# Patient Record
Sex: Male | Born: 1970 | Race: White | Hispanic: No | Marital: Married | Smoking: Never smoker
Health system: Southern US, Community
[De-identification: ages and names within clinical notes are randomized; demographics above are authoritative.]

## PROBLEM LIST (undated history)

## (undated) DIAGNOSIS — M109 Gout, unspecified: Secondary | ICD-10-CM

## (undated) DIAGNOSIS — J45909 Unspecified asthma, uncomplicated: Secondary | ICD-10-CM

## (undated) DIAGNOSIS — Z8739 Personal history of other diseases of the musculoskeletal system and connective tissue: Secondary | ICD-10-CM

## (undated) DIAGNOSIS — R1310 Dysphagia, unspecified: Secondary | ICD-10-CM

## (undated) DIAGNOSIS — K219 Gastro-esophageal reflux disease without esophagitis: Secondary | ICD-10-CM

## (undated) DIAGNOSIS — M1990 Unspecified osteoarthritis, unspecified site: Secondary | ICD-10-CM

## (undated) DIAGNOSIS — J189 Pneumonia, unspecified organism: Secondary | ICD-10-CM

## (undated) HISTORY — DX: Gout, unspecified: M10.9

## (undated) HISTORY — DX: Gastro-esophageal reflux disease without esophagitis: K21.9

## (undated) HISTORY — PX: BACK SURGERY: SHX140

## (undated) HISTORY — DX: Dysphagia, unspecified: R13.10

---

## 1999-01-27 ENCOUNTER — Encounter: Payer: Self-pay | Admitting: Neurosurgery

## 1999-01-27 ENCOUNTER — Ambulatory Visit (HOSPITAL_COMMUNITY): Admission: RE | Admit: 1999-01-27 | Discharge: 1999-01-27 | Payer: Self-pay | Admitting: Neurosurgery

## 1999-02-10 ENCOUNTER — Ambulatory Visit (HOSPITAL_COMMUNITY): Admission: RE | Admit: 1999-02-10 | Discharge: 1999-02-10 | Payer: Self-pay | Admitting: Neurosurgery

## 1999-02-10 ENCOUNTER — Encounter: Payer: Self-pay | Admitting: Neurosurgery

## 1999-02-24 ENCOUNTER — Ambulatory Visit (HOSPITAL_COMMUNITY): Admission: RE | Admit: 1999-02-24 | Discharge: 1999-02-24 | Payer: Self-pay | Admitting: Neurosurgery

## 1999-02-24 ENCOUNTER — Encounter: Payer: Self-pay | Admitting: Neurosurgery

## 2000-02-26 ENCOUNTER — Ambulatory Visit (HOSPITAL_COMMUNITY): Admission: RE | Admit: 2000-02-26 | Discharge: 2000-02-26 | Payer: Self-pay | Admitting: Neurosurgery

## 2000-02-26 ENCOUNTER — Encounter: Payer: Self-pay | Admitting: Neurosurgery

## 2000-04-23 ENCOUNTER — Ambulatory Visit (HOSPITAL_COMMUNITY): Admission: RE | Admit: 2000-04-23 | Discharge: 2000-04-23 | Payer: Self-pay | Admitting: Neurosurgery

## 2000-04-23 ENCOUNTER — Encounter: Payer: Self-pay | Admitting: Neurosurgery

## 2000-08-25 ENCOUNTER — Emergency Department (HOSPITAL_COMMUNITY): Admission: EM | Admit: 2000-08-25 | Discharge: 2000-08-25 | Payer: Self-pay | Admitting: Emergency Medicine

## 2001-11-29 ENCOUNTER — Emergency Department (HOSPITAL_COMMUNITY): Admission: EM | Admit: 2001-11-29 | Discharge: 2001-11-29 | Payer: Self-pay | Admitting: Emergency Medicine

## 2001-12-30 ENCOUNTER — Encounter: Payer: Self-pay | Admitting: Neurosurgery

## 2002-01-03 ENCOUNTER — Encounter: Payer: Self-pay | Admitting: Neurosurgery

## 2002-01-03 ENCOUNTER — Inpatient Hospital Stay (HOSPITAL_COMMUNITY): Admission: RE | Admit: 2002-01-03 | Discharge: 2002-01-06 | Payer: Self-pay | Admitting: Neurosurgery

## 2002-06-06 ENCOUNTER — Encounter: Admission: RE | Admit: 2002-06-06 | Discharge: 2002-06-06 | Payer: Self-pay | Admitting: Neurosurgery

## 2002-06-06 ENCOUNTER — Encounter: Payer: Self-pay | Admitting: Neurosurgery

## 2004-04-02 ENCOUNTER — Ambulatory Visit (HOSPITAL_COMMUNITY): Admission: RE | Admit: 2004-04-02 | Discharge: 2004-04-02 | Payer: Self-pay | Admitting: Neurosurgery

## 2007-01-22 ENCOUNTER — Emergency Department (HOSPITAL_COMMUNITY): Admission: EM | Admit: 2007-01-22 | Discharge: 2007-01-22 | Payer: Self-pay | Admitting: Emergency Medicine

## 2007-03-07 ENCOUNTER — Ambulatory Visit (HOSPITAL_COMMUNITY): Admission: RE | Admit: 2007-03-07 | Discharge: 2007-03-07 | Payer: Self-pay | Admitting: Family Medicine

## 2007-12-13 ENCOUNTER — Encounter (INDEPENDENT_AMBULATORY_CARE_PROVIDER_SITE_OTHER): Payer: Self-pay | Admitting: Urology

## 2007-12-13 ENCOUNTER — Other Ambulatory Visit: Admission: RE | Admit: 2007-12-13 | Discharge: 2007-12-13 | Payer: Self-pay | Admitting: Urology

## 2009-05-23 ENCOUNTER — Ambulatory Visit (HOSPITAL_COMMUNITY): Admission: RE | Admit: 2009-05-23 | Discharge: 2009-05-23 | Payer: Self-pay | Admitting: Family Medicine

## 2011-04-10 NOTE — H&P (Signed)
[place]. [facility]  Patient:    MARILYN, WING Visit Number: [id number] MRN: [id number]          Service Type: SUR Location: 3000 3012 [id number] Attending Physician:  Emeterio Reeve Dictated by:   Payton Doughty, M.D. 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  HISTORY:  This is a very nice 40 year old right-handed white gentleman who I saw a few years ago with degenerative change at L4-5 and L5-S1.  He has undergone epidural steroids, has undergone physical therapy.  It was my recommendation about almost a year ago that the patient proceed with a fusion because of worsening clinical condition.  His insurance company, particularly a Dr. Fayne Norrie, felt that this was not the case and without benefit of ever having seen or examined the patient denied this.  They furthermore contacted me and asked me to change my disability rating on the patient which I felt was unethical and refused to do.  Mr. Dehoyos on December 23 has had a marked increase in his low back pain with pain and numbness down both legs and a repeat MRI that demonstrates progression of his disease at both levels and he is now admitted for a fusion.  PAST MEDICAL HISTORY:  Otherwise benign.  He uses Vicodin on a p.r.n. basis.  ALLERGIES:  None.  PAST SURGICAL HISTORY:  Denies.  SOCIAL HISTORY:  He smokes half a pack of cigarettes a day.  Drinks alcohol only socially.  Is a Psychologist, occupational.  FAMILY HISTORY:  Mother had spondylolysis and father has also had a back operation.  REVIEW OF SYSTEMS:  Unremarkable except for back pain.  PHYSICAL EXAMINATION  HEENT:  Within normal limits.  NECK:  Reasonable range of motion.  CHEST:  Crackles.  CARDIAC:  Regular rate and rhythm.  No murmur.  ABDOMEN:  Nontender with no hepatosplenomegaly.  EXTREMITIES:  Without clubbing or cyanosis.  Peripheral pulses are  good.  GENITOURINARY:  Deferred.  NEUROLOGIC:  He is awake, alert, and oriented.  Cranial nerves are intact. Motor examination shows 5/5 throughout the lower extremities save for giveaway weakness because of back and leg pain.  This is mostly in the hip flexor and dorsiflexors.  Reflexes are 1 at the knees, absent at the ankles.  Straight leg raise is bilaterally positive.  LABORATORIES:  MRI has been reviewed above.  CLINICAL IMPRESSION:  Progressive lumbar spondylosis with intractable back pain.  PLAN:  Laminectomy, diskectomy, posterior lumbar antibody fusion at L4-5 and L5-S1.  The risks and benefits of this approach have been discussed with him and he wishes to proceed. Dictated by:   Payton Doughty, M.D. Attending Physician:  Emeterio Reeve DD:  01/03/02 TD:  01/03/02 Job: [id number]

## 2011-04-10 NOTE — Discharge Summary (Signed)
[place]. [facility]  Patient:    BRYLEN, WAGAR Visit Number: [id number] MRN: [id number]          Service Type: SUR Location: 3000 3012 01 Attending Physician:  Emeterio Reeve Dictated by:   Payton Doughty, M.D. Admit Date:  01/03/2002 Discharge Date: 01/06/2002                             Discharge Summary  ADMITTING DIAGNOSIS:  Spondylosis and herniated disk L4-5, L5-S1.  DISCHARGE DIAGNOSIS:  Spondylosis and herniated disk L4-5, L5-S1.  PROCEDURE:  L4-5, L5-S1 laminectomy, diskectomy, posterior lumbar interbody fusion.  SERVICE:  Neurosurgery.  COMPLICATIONS:  None.  DISCHARGE STATUS:  Fine and well.  HISTORY OF PRESENT ILLNESS:  A 40 year old right-handed white gentleman whose history and physical is recounted in the chart.  He has had spondylosis of the lumbar spine with progressive deficit.  His pain has become intractable. Recent MRI shows worsening degenerative change with a disk at 4-5 and he was admitted for fusion.  MEDICAL HISTORY:  Benign.  ALLERGIES:  None.  GENERAL EXAMINATION:  Intact.  NEUROLOGIC EXAMINATION:  Intact with extremely limited range of motion in his back and positive straight leg raise.  HOSPITAL COURSE:  He was admitted after ascertaining normal laboratory values and underwent laminectomy, diskectomy, posterior lumbar interbody fusion at 4-5 and 5-1.  Postoperatively he has done well.  Foley was removed the first day, required one more catheterization but now is voiding normally.  Pain control was good with PCA, was weaned off to Percocet; when switched to Vicodin, it did not work.  On exam, the rest of his strength is full.  He is now back on Percocet for pain control as well as Flexeril for spasm.  His incision is well healed.  DISPOSITION:  He is being discharged home in the care of his family on Percocet for pain with follow-up to be in the [facility] office in a week for suture  removal. Dictated by:   Payton Doughty, M.D. Attending Physician:  Emeterio Reeve DD:  01/06/02 TD:  01/06/02 Job: [id number]

## 2011-04-10 NOTE — Op Note (Signed)
[place]. [facility]  Patient:    Erik Reid, Erik Reid Visit Number: [id number] MRN: [id number]          Service Type: SUR Location: 3000 3012 01 Attending Physician:  Emeterio Reeve Dictated by:   Payton Doughty, M.D. Proc. Date: 01/03/02 Admit Date:  01/03/2002                             Operative Report  PREOPERATIVE DIAGNOSIS:  Spondylosis L4-5, L5-S1.  POSTOPERATIVE DIAGNOSIS:  Spondylosis L4-5, L5-S1.  PROCEDURE:  L4-5 and L5-S1 laminectomy and diskectomy, posterior lumbar interbody fusion with Ray threaded fusion cage.  SURGEON:  Payton Doughty, M.D.  NURSE ASSISTANT:  [facility].  DOCTOR ASSISTANT:  Tanya Nones. Jeral Fruit, M.D.  ANESTHESIA:  General endotracheal.  PREP:  Shave and prepped, scrubbed with alcohol wipe.  COMPLICATIONS:  None.  DESCRIPTION OF PROCEDURE:  This is a 40 year old right-handed, white gentleman with severe lumbar spondylosis at 4-5 and 5-1.  He was taken to the operating room, smoothly anesthetized, intubated and placed prone on the operating table.  Following shave, prepped and draped in usual sterile fashion.  The skin was infiltrated with 1% lidocaine with 1:400,000 epinephrine.  The skin was incised from the top of L4 to the bottom of S1.  The laminae of L4, L5, and S1 were exposed bilaterally in a subperiosteal plane.  Intraoperative x-ray confirmed correctness of level.  The pars interarticularis, lamina and inferior facet of L4 and L5, and the superior facet of L5 and S1 were removed bilaterally, as well as, ligamentum flavum.  At L5-S1, there was no articular cartilage remaining in the facet joint.  The disk itself was quite degenerated.  At 4-5, there was a significant herniated disk in the midline slightly ______ to the right once again with severely spondylitic joints. Following complete resection of disk in these joints and decompression of the nerve roots, Ray threaded fusion cages were placed.  At 5-1, 14 x 26  mm cages were placed and at 4-5, 16 x 26 mm cages were placed.  Intraoperative x-ray showed good placement of the cages and they were packed with bone graft harvested from facet joints and capped.  The wound was irrigated and hemostasis assured.  The fascia reapproximated with 0 Vicryl in interrupted fashion, subcutaneous tissues reapproximated with 0 Vicryl in interrupted fashion, the subcuticular tissues were reapproximated with 3-0 Vicryl interrupted fashion.  The skin was closed with 3-0 nylon in a running lock fashion.  Betadine, Telfa dressing was applied and made occlusive with OpSite. The patient was then returned to the recovery room in good condition. Dictated by:   Payton Doughty, M.D. Attending Physician:  Emeterio Reeve DD:  01/03/02 TD:  01/03/02 Job: [id number]

## 2011-08-31 ENCOUNTER — Emergency Department (HOSPITAL_COMMUNITY)
Admission: EM | Admit: 2011-08-31 | Discharge: 2011-08-31 | Disposition: A | Discharge status: Home / Self Care | Payer: 59 | Attending: Emergency Medicine | Admitting: Emergency Medicine

## 2011-08-31 DIAGNOSIS — H9190 Unspecified hearing loss, unspecified ear: Secondary | ICD-10-CM | POA: Insufficient documentation

## 2011-08-31 DIAGNOSIS — R209 Unspecified disturbances of skin sensation: Secondary | ICD-10-CM | POA: Insufficient documentation

## 2011-08-31 DIAGNOSIS — H7290 Unspecified perforation of tympanic membrane, unspecified ear: Secondary | ICD-10-CM

## 2011-08-31 DIAGNOSIS — H9209 Otalgia, unspecified ear: Secondary | ICD-10-CM | POA: Insufficient documentation

## 2011-08-31 DIAGNOSIS — H9210 Otorrhea, unspecified ear: Secondary | ICD-10-CM | POA: Insufficient documentation

## 2011-08-31 DIAGNOSIS — R51 Headache: Secondary | ICD-10-CM | POA: Insufficient documentation

## 2011-08-31 MED ORDER — CIPROFLOXACIN-DEXAMETHASONE 0.3-0.1 % OT SUSP: 4.0000 [drp] | Freq: Two times a day (BID) | OTIC | Status: AC

## 2011-08-31 MED ORDER — CIPROFLOXACIN-DEXAMETHASONE 0.3-0.1 % OT SUSP: 4.0000 [drp] | Freq: Two times a day (BID) | OTIC | Status: DC

## 2011-08-31 MED ORDER — IBUPROFEN 800 MG PO TABS: 800.0000 mg | ORAL_TABLET | Freq: Three times a day (TID) | ORAL | Status: DC | PRN

## 2011-08-31 MED ORDER — IBUPROFEN 800 MG PO TABS: 800.0000 mg | ORAL_TABLET | Freq: Three times a day (TID) | ORAL | Status: AC | PRN

## 2011-08-31 NOTE — ED Provider Notes (Signed)
I have personally seen and examined the patient.  I have discussed the plan of care with the resident.  I have reviewed the documentation on PMH/FH/Soc. History.  I have reviewed the documentation of the resident and agree.  Doug Sou, MD 08/31/11 [phone]

## 2011-08-31 NOTE — ED Provider Notes (Signed)
Patient with right ear pain for 6 weeks becoming worse with diminished hearing tonight on exam alert nontoxic right ear drum is perforated with serous material coming from his ear canal plan, discussed with Dr.TEoh , cipordex gtt, office f/u 3 day  Doug Sou, MD 08/31/11 [phone]

## 2011-08-31 NOTE — ED Notes (Signed)
Right earache x 1 month

## 2011-08-31 NOTE — ED Provider Notes (Signed)
History     CSN: [id number] Arrival date & time: 08/31/2011  5:45 AM  Chief Complaint  Patient presents with  . Otalgia    (Consider location/radiation/quality/duration/timing/severity/associated sxs/prior treatment) HPI Comments: Right ear pain started 6 weeks ago and has been getting increasingly worse.  Pain acutely worse last night and started having clear drainage. Not sure if he had trauma to his ear. 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   Also now complaining of headache on the right side of his head and decreased sensation on the right side of his face and in his ear drum. Complains of decreased hearing in right ear.   Denies systemic symptoms of illness including fever, chills, sore throat, difficulty breathing, cough, runny nose.   Has not taken anything for pain.   Patient is a 40 y.o. male presenting with ear pain. The history is provided by the patient.  Otalgia Associated symptoms include ear discharge, headaches and hearing loss. Pertinent negatives include no rhinorrhea, no sore throat, no abdominal pain, no diarrhea, no vomiting, no neck pain, no cough and no rash. His past medical history does not include chronic ear infection, hearing loss or tympanostomy tube.    Past Medical History  Diagnosis Date  . Gout     Past Surgical History  Procedure Date  . Back surgery     No family history on file.  History  Substance Use Topics  . Smoking status: Never Smoker   . Smokeless tobacco: Not on file  . Alcohol Use: Yes      Review of Systems  Constitutional: Negative for fever and chills.  HENT: Positive for hearing loss, ear pain and ear discharge. Negative for sore throat, rhinorrhea and neck pain.   Eyes: Negative.   Respiratory: Negative for cough.   Gastrointestinal: Negative for vomiting, abdominal pain and diarrhea.  Musculoskeletal:  Negative for arthralgias.  Skin: Negative for rash.  Neurological: Positive for numbness and headaches. Negative for facial asymmetry, speech difficulty and weakness.    Allergies  Review of patient's allergies indicates no known allergies.  Home Medications   Current Outpatient Rx  Name Route Sig Dispense Refill  . ALLOPURINOL 100 MG PO TABS Oral Take 100 mg by mouth as needed.        BP 123/82  Pulse 57  Temp(Src) 98.9 F (37.2 C) (Oral)  Resp 16  Ht 6' (1.829 m)  Wt 230 lb (104.327 kg)  BMI 31.19 kg/m2  SpO2 98%  Physical Exam  Constitutional: He is oriented to person, place, and time. No distress.       Uncomfortable appearing  HENT:  Right Ear: Ear canal normal. There is drainage. No tenderness.  Left Ear: Hearing, tympanic membrane, external ear and ear canal normal. No drainage.  Nose: Nose normal. No mucosal edema. Right sinus exhibits no maxillary sinus tenderness and no frontal sinus tenderness. Left sinus exhibits no maxillary sinus tenderness and no frontal sinus tenderness.  Mouth/Throat: Uvula is midline, oropharynx is clear and moist and mucous membranes are normal.       No canal erythema or tenderness.  Ear drum may have perforation. Difficult to fully visualize due to white-colored pus/wax/debris on inferior aspect of ear.   Eyes: Right eye exhibits no discharge. Left eye exhibits no discharge.  Neck: Normal range of motion. Neck supple.  Pulmonary/Chest: Effort normal  and breath sounds normal. No respiratory distress.  Lymphadenopathy:    He has no cervical adenopathy.  Neurological: He is alert and oriented to person, place, and time. No cranial nerve deficit. Coordination normal.  Skin: Skin is warm and dry. No rash noted. He is not diaphoretic. No erythema. No pallor.    ED Course  Procedures (including critical care time)  Labs Reviewed - No data to display No results found.   No diagnosis found.   MDM  This is a 40 YO Caucasian male with  no significant past medical history who presents who 6 weeks of right ear pain without any systemic symptoms of illness and now with acutely worsened pain and serous drainage concerning for perforated ear drum.   Consulted ENT physician on-call Dr. Suszanne Conners by phone who recommends Ciprodex x 7 days and follow-up at his [place] office on 10/11.       Lucianne [place] Resident 08/31/11 [phone]

## 2011-09-03 ENCOUNTER — Ambulatory Visit (INDEPENDENT_AMBULATORY_CARE_PROVIDER_SITE_OTHER): Payer: 59 | Admitting: Otolaryngology

## 2011-09-03 DIAGNOSIS — H7200 Central perforation of tympanic membrane, unspecified ear: Secondary | ICD-10-CM

## 2011-09-03 DIAGNOSIS — H66019 Acute suppurative otitis media with spontaneous rupture of ear drum, unspecified ear: Secondary | ICD-10-CM

## 2011-09-03 DIAGNOSIS — H902 Conductive hearing loss, unspecified: Secondary | ICD-10-CM

## 2011-09-17 ENCOUNTER — Ambulatory Visit (INDEPENDENT_AMBULATORY_CARE_PROVIDER_SITE_OTHER): Payer: 59 | Admitting: Otolaryngology

## 2011-09-17 DIAGNOSIS — H7200 Central perforation of tympanic membrane, unspecified ear: Secondary | ICD-10-CM

## 2011-09-17 DIAGNOSIS — H902 Conductive hearing loss, unspecified: Secondary | ICD-10-CM

## 2012-05-08 ENCOUNTER — Ambulatory Visit (HOSPITAL_COMMUNITY)
Admission: EM | Admit: 2012-05-08 | Discharge: 2012-05-08 | Disposition: A | Discharge status: Home / Self Care | Payer: Self-pay | Attending: Internal Medicine | Admitting: Internal Medicine

## 2012-05-08 ENCOUNTER — Encounter (HOSPITAL_COMMUNITY): Payer: Self-pay | Admitting: Emergency Medicine

## 2012-05-08 ENCOUNTER — Emergency Department (HOSPITAL_COMMUNITY): Payer: Self-pay

## 2012-05-08 ENCOUNTER — Encounter (HOSPITAL_COMMUNITY)
Admission: EM | Disposition: A | Discharge status: Home / Self Care | Payer: Self-pay | Source: Home / Self Care | Attending: Emergency Medicine

## 2012-05-08 DIAGNOSIS — K222 Esophageal obstruction: Secondary | ICD-10-CM

## 2012-05-08 DIAGNOSIS — K228 Other specified diseases of esophagus: Secondary | ICD-10-CM

## 2012-05-08 DIAGNOSIS — IMO0002 Reserved for concepts with insufficient information to code with codable children: Secondary | ICD-10-CM | POA: Insufficient documentation

## 2012-05-08 DIAGNOSIS — K449 Diaphragmatic hernia without obstruction or gangrene: Secondary | ICD-10-CM

## 2012-05-08 DIAGNOSIS — K2270 Barrett's esophagus without dysplasia: Secondary | ICD-10-CM | POA: Insufficient documentation

## 2012-05-08 DIAGNOSIS — T18108A Unspecified foreign body in esophagus causing other injury, initial encounter: Secondary | ICD-10-CM

## 2012-05-08 HISTORY — PX: FOREIGN BODY REMOVAL: SHX962

## 2012-05-08 HISTORY — PX: ESOPHAGOGASTRODUODENOSCOPY: SHX5428

## 2012-05-08 HISTORY — PX: ESOPHAGEAL DILATION: SHX303

## 2012-05-08 SURGERY — EGD (ESOPHAGOGASTRODUODENOSCOPY)
Anesthesia: Moderate Sedation | Site: Esophagus

## 2012-05-08 MED ORDER — MIDAZOLAM HCL 5 MG/5ML IJ SOLN
INTRAMUSCULAR | Status: DC | PRN
  Administered 2012-05-08: 3 mg via INTRAVENOUS
  Administered 2012-05-08 (×3): 2 mg via INTRAVENOUS

## 2012-05-08 MED ORDER — BUTAMBEN-TETRACAINE-BENZOCAINE 2-2-14 % EX AERO
INHALATION_SPRAY | CUTANEOUS | Status: DC | PRN
  Administered 2012-05-08: 2 via TOPICAL

## 2012-05-08 MED ORDER — MEPERIDINE HCL 25 MG/ML IJ SOLN
INTRAMUSCULAR | Status: DC | PRN
  Administered 2012-05-08: 25 mg via INTRAVENOUS

## 2012-05-08 MED ORDER — SODIUM CHLORIDE 0.9 % IV SOLN
INTRAVENOUS | Status: DC
  Administered 2012-05-08: 08:00:00 via INTRAVENOUS

## 2012-05-08 MED ORDER — PANTOPRAZOLE SODIUM 40 MG PO TBEC: 40.0000 mg | DELAYED_RELEASE_TABLET | Freq: Two times a day (BID) | ORAL | Status: DC

## 2012-05-08 MED ORDER — STERILE WATER FOR IRRIGATION IR SOLN
Status: DC | PRN
  Administered 2012-05-08: 10:00:00

## 2012-05-08 MED ORDER — GLUCAGON HCL (RDNA) 1 MG IJ SOLR
1.0000 mg | Freq: Once | INTRAMUSCULAR | Status: AC
  Administered 2012-05-08: 1 mg via INTRAVENOUS
  Filled 2012-05-08: qty 1

## 2012-05-08 NOTE — ED Notes (Signed)
Pt states that he was eating lamb chop last night, felt like something got "stuck in his chest" area while eating. Pt sitting up on stretcher, spitting in bag at present, pt states that he has had food "get stuck" twice before and was able to drink water afterward to help the food go down. Pt unable to tolerate drinking water at present,

## 2012-05-08 NOTE — Discharge Instructions (Signed)
No driving for 24 hours. Anti-reflux measures. Resume Uloric at usual dose. Remember to chew food thoroughly before you attempt to swallow. Pantoprazole 40 mg by mouth 30 minutes before breakfast and evening meal daily. Office visit in 3 months.   Gastroesophageal Reflux Disease, Adult Gastroesophageal reflux disease (GERD) happens when acid from your stomach flows up into the esophagus. When acid comes in contact with the esophagus, the acid causes soreness (inflammation) in the esophagus. Over time, GERD may create small holes (ulcers) in the lining of the esophagus. CAUSES   Increased body weight. This puts pressure on the stomach, making acid rise from the stomach into the esophagus.   Smoking. This increases acid production in the stomach.   Drinking alcohol. This causes decreased pressure in the lower esophageal sphincter (valve or ring of muscle between the esophagus and stomach), allowing acid from the stomach into the esophagus.   Late evening meals and a full stomach. This increases pressure and acid production in the stomach.   A malformed lower esophageal sphincter.  Sometimes, no cause is found. SYMPTOMS   Burning pain in the lower part of the mid-chest behind the breastbone and in the mid-stomach area. This may occur twice a week or more often.   Trouble swallowing.   Sore throat.   Dry cough.   Asthma-like symptoms including chest tightness, shortness of breath, or wheezing.  DIAGNOSIS  Your caregiver may be able to diagnose GERD based on your symptoms. In some cases, X-rays and other tests may be done to check for complications or to check the condition of your stomach and esophagus. TREATMENT  Your caregiver may recommend over-the-counter or prescription medicines to help decrease acid production. Ask your caregiver before starting or adding any new medicines.  HOME CARE INSTRUCTIONS   Change the factors that you can control. Ask your caregiver for guidance  concerning weight loss, quitting smoking, and alcohol consumption.   Avoid foods and drinks that make your symptoms worse, such as:   Caffeine or alcoholic drinks.   Chocolate.   Peppermint or mint flavorings.   Garlic and onions.   Spicy foods.   Citrus fruits, such as oranges, lemons, or limes.   Tomato-based foods such as sauce, chili, salsa, and pizza.   Fried and fatty foods.   Avoid lying down for the 3 hours prior to your bedtime or prior to taking a nap.   Eat small, frequent meals instead of large meals.   Wear loose-fitting clothing. Do not wear anything tight around your waist that causes pressure on your stomach.   Raise the head of your bed 6 to 8 inches with wood blocks to help you sleep. Extra pillows will not help.   Only take over-the-counter or prescription medicines for pain, discomfort, or fever as directed by your caregiver.   Do not take aspirin, ibuprofen, or other nonsteroidal anti-inflammatory drugs (NSAIDs).  SEEK IMMEDIATE MEDICAL CARE IF:   You have pain in your arms, neck, jaw, teeth, or back.   Your pain increases or changes in intensity or duration.   You develop nausea, vomiting, or sweating (diaphoresis).   You develop shortness of breath, or you faint.   Your vomit is green, yellow, black, or looks like coffee grounds or blood.   Your stool is red, bloody, or black.  These symptoms could be signs of other problems, such as heart disease, gastric bleeding, or esophageal bleeding. MAKE SURE YOU:   Understand these instructions.   Will watch your condition.  Will get help right away if you are not doing well or get worse.  Document Released: 08/19/2005 Document Revised: 10/29/2011 Document Reviewed: 05/29/2011 [facility] Patient Information 2012 [place], [place].    Diet for GERD or PUD Nutrition therapy can help ease the discomfort of gastroesophageal reflux disease (GERD) and peptic ulcer disease (PUD).  HOME CARE INSTRUCTIONS     Eat your meals slowly, in a relaxed setting.   Eat 5 to 6 small meals per day.   If a food causes distress, stop eating it for a period of time.  FOODS TO AVOID  Coffee, regular or decaffeinated.   Cola beverages, regular or low calorie.   Tea, regular or decaffeinated.   Pepper.   Cocoa.   High fat foods, including meats.   Butter, margarine, hydrogenated oil (trans fats).   Peppermint or spearmint (if you have GERD).   Fruits and vegetables if not tolerated.   Alcohol.   Nicotine (smoking or chewing). This is one of the most potent stimulants to acid production in the gastrointestinal tract.   Any food that seems to aggravate your condition.  If you have questions regarding your diet, ask your caregiver or a registered dietitian. TIPS  Lying flat may make symptoms worse. Keep the head of your bed raised 6 to 9 inches (15 to 23 cm) by using a foam wedge or blocks under the legs of the bed.   Do not lay down until 3 hours after eating a meal.   Daily physical activity may help reduce symptoms.  MAKE SURE YOU:   Understand these instructions.   Will watch your condition.   Will get help right away if you are not doing well or get worse.  Document Released: 11/09/2005 Document Revised: 10/29/2011 Document Reviewed: 09/25/2011 [facility] Patient Information 2012 [place], [place].    PATIENT INSTRUCTIONS POST-ANESTHESIA  IMMEDIATELY FOLLOWING SURGERY:  Do not drive or operate machinery for the first twenty four hours after surgery.  Do not make any important decisions for twenty four hours after surgery or while taking narcotic pain medications or sedatives.  If you develop intractable nausea and vomiting or a severe headache please notify your doctor immediately.  FOLLOW-UP:  Please make an appointment with your surgeon as instructed. You do not need to follow up with anesthesia unless specifically instructed to do so.  WOUND CARE INSTRUCTIONS (if applicable):   Keep a dry clean dressing on the anesthesia/puncture wound site if there is drainage.  Once the wound has quit draining you may leave it open to air.  Generally you should leave the bandage intact for twenty four hours unless there is drainage.  If the epidural site drains for more than 36-48 hours please call the anesthesia department.  QUESTIONS?:  Please feel free to call your physician or the hospital operator if you have any questions, and they will be happy to assist you.

## 2012-05-08 NOTE — H&P (Signed)
Erik Reid is an 41 y.o. male.   Chief Complaint: Patient is here for EGD, esophageal foreign body removal and esophageal dilation. HPI: Patient is 41 year old Caucasian male with history of chronic heartburn is currently not on any medications who has experienced intermittent episodes of dysphagia with solids over the last 2 years. Food bolus  always passed spontaneously  or regurgitated but not this time at this time.This happened when he was eating while he was eating evening meal yesterday. He waited all night hoping to get relief spontaneously. He finally came to emergency room and was sent over for therapeutic EGD. He denies weight loss abdominal pain or melena. Patient has never undergone evaluation of his upper GI tract.  Past Medical History  Diagnosis Date  . Gout     Past Surgical History  Procedure Date  . Back surgery     History reviewed. No pertinent family history. Social History:  reports that he has never smoked. His smokeless tobacco use includes Snuff. He reports that he drinks about 7.2 ounces of alcohol per week. He reports that he does not use illicit drugs.  Allergies: No Known Allergies  Medications Prior to Admission  Medication Sig Dispense Refill  . allopurinol (ZYLOPRIM) 100 MG tablet Take 100 mg by mouth as needed.          No results found for this or any previous visit (from the past 48 hour(s)). Dg Chest Port 1 View  05/08/2012  *RADIOLOGY REPORT*  Clinical Data: Evaluate for esophageal foreign body.  Swallowed lamb chop bone.  PORTABLE CHEST - 1 VIEW  Comparison: Chest x-ray 06/02/2009.  Findings: Lung volumes are normal.  No consolidative airspace disease.  No pleural effusions.  No pneumothorax.  No pulmonary nodule or mass noted.  Pulmonary vasculature and the cardiomediastinal silhouette are within normal limits.  IMPRESSION: 1. No radiographic evidence of acute cardiopulmonary disease. 2.  Specifically, no radiopaque foreign body identified.   Original Report Authenticated By: Florencia Reasons, M.D.    ROS  Blood pressure 109/75, pulse 68, temperature 98.2 F (36.8 C), temperature source Oral, resp. rate 16, height 6\' 1"  (1.854 m), weight 235 lb (106.595 kg), SpO2 96.00%. Physical Exam  Constitutional: He appears well-developed and well-nourished.  HENT:  Mouth/Throat: Oropharynx is clear and moist.  Eyes: Conjunctivae are normal. No scleral icterus.  Neck: No thyromegaly present.  Cardiovascular: Normal rate, regular rhythm and normal heart sounds.   No murmur heard. Respiratory: Effort normal.  GI: Soft. He exhibits no distension and no mass.  Musculoskeletal: He exhibits no edema.  Lymphadenopathy:    He has no cervical adenopathy.  Neurological: He is alert.  Skin: Skin is warm and dry.     Assessment/Plan Foreign body esophagus. EGD with FB removal and esophageal dilation.  Brexley Cutshaw U 05/08/2012, 9:39 AM

## 2012-05-08 NOTE — ED Notes (Addendum)
Patient reports eating steak last night. Patient states "I have a piece of lamb chop stuck in my chest."  Patient points to mid-sternal area. Per patient tries to swallow water, comes back up. Patient also reports difficult and pain when swallowing saliva.

## 2012-05-08 NOTE — ED Provider Notes (Signed)
History  This chart was scribed for Laray Anger, DO by Bennett Scrape. This patient was seen in room [facility] and the patient's care was started at 7:48AM.  CSN: [id number]  Arrival date & time 05/08/12  1191   First MD Initiated Contact with Patient 05/08/12 [phone]      Chief Complaint  Patient presents with  . Foreign Body    The history is provided by the patient. No language interpreter was used.    Erik Reid is a 41 y.o. male who presents to the Emergency Department complaining of sudden onset and persistence of constant esophageal foreign body that began last night approx 1800.  Pt describes his symptoms as "a piece of lamb chop got stuck" while he was eating last night.  Has been associated with inability to eat, drink fluids, or swallow his oral secretions without regurgitating.  Pt endorses experiencing similar symptoms several times previously, but that he is generally able to get the piece of food down after drinking water.  Has not been eval by a GI doctor for same.  Denies SOB/cough, no palpitations, no neck pain, no abd pain, no back pain, no diarrhea, no black or blood in emesis.   PCP is Dr. Renard Matter.  Past Medical History  Diagnosis Date  . Gout     Past Surgical History  Procedure Date  . Back surgery     History  Substance Use Topics  . Smoking status: Never Smoker   . Smokeless tobacco: Not on file  . Alcohol Use: Yes    Review of Systems ROS: Statement: All systems negative except as marked or noted in the HPI; Constitutional: Negative for fever and chills. ; ; Eyes: Negative for eye pain, redness and discharge. ; ; ENMT: Negative for ear pain, hoarseness, nasal congestion, sinus pressure and sore throat. ; ; Cardiovascular: Negative for chest pain, palpitations, diaphoresis, dyspnea and peripheral edema. ; ; Respiratory: Negative for cough, wheezing and stridor. ; ; Gastrointestinal: +FB esophagus.  Negative for diarrhea, abdominal pain, blood  in stool, hematemesis, jaundice and rectal bleeding. . ; ; Genitourinary: Negative for dysuria, flank pain and hematuria. ; ; Musculoskeletal: Negative for back pain and neck pain. Negative for swelling and trauma.; ; Skin: Negative for pruritus, rash, abrasions, blisters, bruising and skin lesion.; ; Neuro: Negative for headache, lightheadedness and neck stiffness. Negative for weakness, altered level of consciousness , altered mental status, extremity weakness, paresthesias, involuntary movement, seizure and syncope.       Allergies  Review of patient's allergies indicates no known allergies.  Home Medications   Current Outpatient Rx  Name Route Sig Dispense Refill  . ALLOPURINOL 100 MG PO TABS Oral Take 100 mg by mouth as needed.        Triage Vitals: BP 124/75  Pulse 63  Temp 98.1 F (36.7 C) (Oral)  Resp 16  Ht 6\' 1"  (1.854 m)  Wt 235 lb (106.595 kg)  BMI 31.00 kg/m2  SpO2 98%  Physical Exam 0805: Physical examination:  Nursing notes reviewed; Vital signs and O2 SAT reviewed;  Constitutional: Well developed, Well nourished, Well hydrated, Uncomfortable appearing; Head:  Normocephalic, atraumatic; Eyes: EOMI, PERRL, No scleral icterus; ENMT: Mouth and pharynx normal, Mucous membranes moist, no drooling or stridor.; Neck: Supple, Full range of motion, No lymphadenopathy; Cardiovascular: Regular rate and rhythm, No murmur or gallop; Respiratory: Breath sounds clear & equal bilaterally, No rales, rhonchi, wheezes.  Speaking full sentences with ease, Normal respiratory effort/excursion; Chest: Nontender, Movement  normal; Abdomen: Soft, Nontender, Nondistended, Normal bowel sounds; Extremities: Pulses normal, No tenderness, No edema, No calf edema or asymmetry.; Neuro: AA&Ox3, Major CN grossly intact.  Speech clear. No gross focal motor or sensory deficits in extremities.; Skin: Color normal, Warm, Dry.   ED Course  Procedures   0805:  Pt is spitting oral secretions into emesis bag  during my exam.  Will obtain PCXR and try 1 dose of IV glucagon.  0845:  Pt continues to spit up secretions into emesis bag.  Resps without distress, Sats remain 96-98% R/A.  T/C to GI Dr. Karilyn Cota, case discussed, including:  HPI, pertinent PM/SHx, VS/PE, dx testing, ED course and treatment:  Agreeable to perform EGD to remove food bolus, requests to speak with ED RN.    MDM  MDM Reviewed: nursing note and vitals Interpretation: x-ray     Dg Chest Port 1 View 05/08/2012  *RADIOLOGY REPORT*  Clinical Data: Evaluate for esophageal foreign body.  Swallowed lamb chop bone.  PORTABLE CHEST - 1 VIEW  Comparison: Chest x-ray 06/02/2009.  Findings: Lung volumes are normal.  No consolidative airspace disease.  No pleural effusions.  No pneumothorax.  No pulmonary nodule or mass noted.  Pulmonary vasculature and the cardiomediastinal silhouette are within normal limits.  IMPRESSION: 1. No radiographic evidence of acute cardiopulmonary disease. 2.  Specifically, no radiopaque foreign body identified.  Original Report Authenticated By: Florencia Reasons, M.D.      I personally performed the services described in this documentation, which was scribed in my presence. The recorded information has been reviewed and considered. Neela Zecca Allison Quarry, DO 05/08/12 1800

## 2012-05-08 NOTE — Op Note (Signed)
EGD PROCEDURE REPORT  PATIENT:  Erik Reid  MR#:  [id number] Birthdate:  28-Aug-1971, 41 y.o., male Endoscopist:  Dr. Malissa Hippo, MD Referred By:  Dr. Marcelline Mates, MD Procedure Date: 05/08/2012  Procedure:   EGD with foreign body removal and esophageal dilation.  Indications:  Patient is 41 year old Caucasian male who presents to emergency room with signs and symptoms of esophageal foreign body. He waited over 12 hours but he would not get spontaneous relief like previous times. He also gives history of chronic heartburn presently not on any medications.            Informed Consent:  The risks, benefits, alternatives & imponderables which include, but are not limited to, bleeding, infection, perforation, drug reaction and potential missed lesion have been reviewed.  The potential for biopsy, lesion removal, esophageal dilation, etc. have also been discussed.  Questions have been answered.  All parties agreeable.  Please see history & physical in medical record for more information.  Medications:  Demerol 25 mg IV Versed 9 mg IV Cetacaine spray topically for oropharyngeal anesthesia  Description of procedure:  The endoscope was introduced through the mouth and advanced to the second portion of the duodenum without difficulty or limitations. The mucosal surfaces were surveyed very carefully during advancement of the scope and upon withdrawal.  Findings:  Esophagus:  Air-fluid level at esophageal body. Fluid was suctioned out. Large impacted foreign body. At 30 cm from the incisors. I was not able to catch it with a Roth net. Using snare I was able to grab it and remove it. Stricture noted with erosions at 30 cm from the incisors which is also junction of squamous and Barrett's epithelium. Few smaller pieces were easily pushed down into the stomach. GEJ:  36 cm Hiatus:  40 cm. Stomach:  Stomach was empty and distended very well with insufflation. Folds in the proximal stomach are  normal. Examination mucosa at body, antrum, pyloric channel, angularis, fundus and cardia was normal. Duodenum:  Normal bulbar and post bulbar mucosa  Therapeutic/Diagnostic Maneuvers Performed:  Esophageal foreign body was removed as above. Stricture at the junction of squamous and Barrett's epithelium was dilated with a balloon. Balloon dilator advanced to the scope. Guidewire pushed in the gastric lumen. Balloon dilator was positioned across the stricture and initially insufflated to 15 mm and then to 16.5 mm. He was maintained for few seconds and advanced distally. Focal mucosal disruption noted post dilation. Balloon was deflated and withdrawn. Patient tolerated the procedure well.  Complications:  None  Impression: Impacted esophageal foreign body removed as above. 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 6 cm tubular Barrett's. 4 cm size sliding hiatal hernia.  Recommendations:  Anti-reflux measures. Pantoprazole 40 mg by mouth twice a day. Office visit prior to next EGD in 3 months in the stricture would be dilated to 18 mm and esophageal biopsy taken from Barrett's.  Halley Kincer U  05/08/2012  10:19 AM  CC: Dr. Alice Reichert, MD & Dr. Bonnetta Barry ref. provider found

## 2012-05-10 ENCOUNTER — Encounter (HOSPITAL_COMMUNITY): Payer: Self-pay | Admitting: Pharmacy Technician

## 2012-05-24 ENCOUNTER — Encounter (HOSPITAL_COMMUNITY): Payer: Self-pay | Admitting: Internal Medicine

## 2012-08-23 ENCOUNTER — Encounter (INDEPENDENT_AMBULATORY_CARE_PROVIDER_SITE_OTHER): Payer: Self-pay | Admitting: Internal Medicine

## 2012-08-23 ENCOUNTER — Encounter (INDEPENDENT_AMBULATORY_CARE_PROVIDER_SITE_OTHER): Payer: Self-pay | Admitting: *Deleted

## 2012-08-23 ENCOUNTER — Ambulatory Visit (INDEPENDENT_AMBULATORY_CARE_PROVIDER_SITE_OTHER): Payer: BC Managed Care – PPO | Admitting: Internal Medicine

## 2012-08-23 ENCOUNTER — Other Ambulatory Visit (INDEPENDENT_AMBULATORY_CARE_PROVIDER_SITE_OTHER): Payer: Self-pay | Admitting: *Deleted

## 2012-08-23 VITALS — BP 110/72 | HR 76 | Temp 97.7°F | Resp 20 | Ht 73.0 in | Wt 232.4 lb

## 2012-08-23 DIAGNOSIS — R1310 Dysphagia, unspecified: Secondary | ICD-10-CM | POA: Insufficient documentation

## 2012-08-23 DIAGNOSIS — K219 Gastro-esophageal reflux disease without esophagitis: Secondary | ICD-10-CM

## 2012-08-23 DIAGNOSIS — M109 Gout, unspecified: Secondary | ICD-10-CM | POA: Insufficient documentation

## 2012-08-23 DIAGNOSIS — J45909 Unspecified asthma, uncomplicated: Secondary | ICD-10-CM | POA: Insufficient documentation

## 2012-08-23 MED ORDER — PANTOPRAZOLE SODIUM 40 MG PO TBEC: 40.0000 mg | DELAYED_RELEASE_TABLET | Freq: Every day | ORAL | Status: DC

## 2012-08-23 NOTE — Patient Instructions (Signed)
Esophagogastroduodenoscopy and esophageal dilation to be scheduled. 

## 2012-08-23 NOTE — Progress Notes (Signed)
Presenting complaint;  Followup for complicated GERD. Patient complains of solid food dysphagia.  History of present illness;  Patient is 41 year old Caucasian male whom I initially saw on 05/08/2012 when he presented emergency room with signs and symptoms of esophageal foreign body. He waited for 12 orifice did not get spontaneous relief. Foreign body was removed from his esophagus. He was noted to have erosive esophagitis with stricture at GE junction and he also had 6 cm long tubular Barrett's and small sliding hiatal hernia. Bad is was not biopsied because of inflammation. Stricture was dilated to 16.5 mm. He was advised to return for scheduled visit in 3 months. He feels much better. He has changed his eating habits. He has lost 12 pounds. He rarely experiences heartburn. He is only taking single dose of pantoprazole. He has had sporadic episodes of dysphagia. He had one 4 days ago when he was eating pork chop. He got spontaneous relief after several minutes. He also has noted  improvement in his respiratory symptoms. He has bronchial asthma. He denies abdominal pain melena or rectal bleeding. He is not experiencing any side effects with pantoprazole.   Current Medications: Current Outpatient Prescriptions  Medication Sig Dispense Refill  . febuxostat (ULORIC) 40 MG tablet Take 40 mg by mouth daily.      . pantoprazole (PROTONIX) 40 MG tablet Take 1 tablet (40 mg total) by mouth daily before breakfast.  30 tablet  11  . DISCONTD: pantoprazole (PROTONIX) 40 MG tablet Take 1 tablet (40 mg total) by mouth 2 (two) times daily before a meal.  60 tablet  5   Past medical history; Bronchial asthma. Gout. Chronic GERD. He has had symptoms for 10 years. He was on PPI which he discontinued about year and a half ago on his own. Had lumbar spine surgery for disease in 2003. Allergies; NKA. Family history; Father died of bone cancer at age 40. Mother, 93 has back problems. He has one brother and  one sister in good health. Social history; He's married. He has 3 healthy children. He smoked cigarettes for one year but quit when he was 41 years old. He drinks one to 2 cans of beer daily.  Objective: Blood pressure 110/72, pulse 76, temperature 97.7 F (36.5 C), temperature source Oral, resp. rate 20, height 6\' 1"  (1.854 m), weight 232 lb 6.4 oz (105.416 kg). Patient is alert and in no acute distress. Conjunctiva is pink. Sclera is nonicteric Oropharyngeal mucosa is normal. No neck masses or thyromegaly noted. Cardiac exam with regular rhythm normal S1 and S2. No murmur or gallop noted. Lungs are clear to auscultation. Abdomen is symmetrical soft and nontender without organomegaly or masses.  No LE edema or clubbing noted.   Assessment:  Erik Reid is 41 year old Caucasian male with chronic GERD complicated by 6 cm long tubular Barrett's(to be confirmed histologically) and distal esophageal stricture which was last dilated to 16.5 mm at the time of foreign body removal from his esophagus. He is starting to experience solid food dysphagia again. His heartburn appears to be well controlled with therapy.   Plan:  Patient must continue anti-reflex measures. Continue pantoprazole at 40 mg by mouth every morning. Prescription renewed for one year. EGD with ED and esophageal biopsy in near future. Office visit in one year.

## 2012-09-01 ENCOUNTER — Encounter (HOSPITAL_COMMUNITY): Payer: Self-pay | Admitting: Pharmacy Technician

## 2012-09-08 MED ORDER — SODIUM CHLORIDE 0.45 % IV SOLN
INTRAVENOUS | Status: DC
  Administered 2012-09-09: 09:00:00 via INTRAVENOUS

## 2012-09-09 ENCOUNTER — Encounter (HOSPITAL_COMMUNITY): Payer: Self-pay | Admitting: *Deleted

## 2012-09-09 ENCOUNTER — Encounter (HOSPITAL_COMMUNITY)
Admission: RE | Disposition: A | Discharge status: Home / Self Care | Payer: Self-pay | Source: Ambulatory Visit | Attending: Internal Medicine

## 2012-09-09 ENCOUNTER — Ambulatory Visit (HOSPITAL_COMMUNITY)
Admission: RE | Admit: 2012-09-09 | Discharge: 2012-09-09 | Disposition: A | Discharge status: Home / Self Care | Payer: BC Managed Care – PPO | Source: Ambulatory Visit | Attending: Internal Medicine | Admitting: Internal Medicine

## 2012-09-09 DIAGNOSIS — K222 Esophageal obstruction: Secondary | ICD-10-CM | POA: Insufficient documentation

## 2012-09-09 DIAGNOSIS — K2270 Barrett's esophagus without dysplasia: Secondary | ICD-10-CM | POA: Insufficient documentation

## 2012-09-09 DIAGNOSIS — R1310 Dysphagia, unspecified: Secondary | ICD-10-CM | POA: Insufficient documentation

## 2012-09-09 DIAGNOSIS — K449 Diaphragmatic hernia without obstruction or gangrene: Secondary | ICD-10-CM

## 2012-09-09 DIAGNOSIS — K219 Gastro-esophageal reflux disease without esophagitis: Secondary | ICD-10-CM

## 2012-09-09 HISTORY — PX: BIOPSY: SHX5522

## 2012-09-09 SURGERY — ESOPHAGOGASTRODUODENOSCOPY (EGD) WITH ESOPHAGEAL DILATION
Anesthesia: Moderate Sedation

## 2012-09-09 MED ORDER — BUTAMBEN-TETRACAINE-BENZOCAINE 2-2-14 % EX AERO
INHALATION_SPRAY | CUTANEOUS | Status: DC | PRN
  Administered 2012-09-09: 2 via TOPICAL

## 2012-09-09 MED ORDER — MEPERIDINE HCL 50 MG/ML IJ SOLN
INTRAMUSCULAR | Status: AC
  Filled 2012-09-09: qty 1

## 2012-09-09 MED ORDER — STERILE WATER FOR IRRIGATION IR SOLN
Status: DC | PRN
  Administered 2012-09-09: 09:00:00

## 2012-09-09 MED ORDER — MIDAZOLAM HCL 5 MG/5ML IJ SOLN
INTRAMUSCULAR | Status: DC | PRN
  Administered 2012-09-09: 3 mg via INTRAVENOUS
  Administered 2012-09-09: 2 mg via INTRAVENOUS
  Administered 2012-09-09: 1 mg via INTRAVENOUS
  Administered 2012-09-09 (×2): 2 mg via INTRAVENOUS

## 2012-09-09 MED ORDER — MEPERIDINE HCL 25 MG/ML IJ SOLN
INTRAMUSCULAR | Status: DC | PRN
  Administered 2012-09-09 (×2): 25 mg via INTRAVENOUS

## 2012-09-09 MED ORDER — MIDAZOLAM HCL 5 MG/5ML IJ SOLN
INTRAMUSCULAR | Status: AC
  Filled 2012-09-09: qty 10

## 2012-09-09 NOTE — H&P (Signed)
Erik Reid is an 41 y.o. male.   Chief Complaint: Patient is here for EGD, ED and esophageal biopsy. HPI: Patient is 41 year old Caucasian male who has chronic GERD complicated by esophageal stricture. He was initially seen on 05/08/2012 via emergency room when he presented with foreign body esophagus. He was found to have a 6 cm long tubular Barrett's distal esophageal stricture, erosive esophagitis and hiatal hernia. Stricture was dilated to 16.5 mm. He has done well with antireflux surgery. He is having mild difficulty now. He is undergoing EGD with dilation and biopsy for histologic confirmation of Barrett's esophagus. His heartburn is well controlled with therapy. He denies abdominal pain or melena.  Past Medical History  Diagnosis Date  . Gout   . GERD (gastroesophageal reflux disease)   . Dysphagia   . Gout     Past Surgical History  Procedure Date  . Back surgery   . Esophagogastroduodenoscopy 05/08/2012    Procedure: ESOPHAGOGASTRODUODENOSCOPY (EGD);  Surgeon: Malissa Hippo, MD;  Location: [facility];  Service: Endoscopy;  Laterality: N/A;  . Foreign body removal 05/08/2012    Procedure: FOREIGN BODY REMOVAL;  Surgeon: Malissa Hippo, MD;  Location: [facility];  Service: Endoscopy;  Laterality: N/A;  . Esophageal dilation 05/08/2012    Procedure: ESOPHAGEAL DILATION;  Surgeon: Malissa Hippo, MD;  Location: [facility];  Service: Endoscopy;  Laterality: N/A;    Family History  Problem Relation Age of Onset  . Healthy Sister   . Gout Brother   . Gout Daughter   . Gout Daughter   . Gout Son    Social History:  reports that he has never smoked. His smokeless tobacco use includes Snuff. He reports that he drinks about 7.2 ounces of alcohol per week. He reports that he does not use illicit drugs.  Allergies: No Known Allergies  Medications Prior to Admission  Medication Sig Dispense Refill  . colchicine 0.6 MG tablet Take 0.6 mg by mouth 2 (two) times daily.        . febuxostat (ULORIC) 40 MG tablet Take 40 mg by mouth daily.      . pantoprazole (PROTONIX) 40 MG tablet Take 1 tablet (40 mg total) by mouth daily before breakfast.  30 tablet  11    No results found for this or any previous visit (from the past 48 hour(s)). No results found.  ROS  Blood pressure 121/81, pulse 60, temperature 98 F (36.7 C), temperature source Oral, resp. rate 18, height 6\' 1"  (1.854 m), weight 232 lb (105.235 kg), SpO2 96.00%. Physical Exam  Constitutional: He appears well-developed and well-nourished.  HENT:  Mouth/Throat: Oropharynx is clear and moist.  Eyes: Conjunctivae normal are normal. No scleral icterus.  Neck: No thyromegaly present.  Cardiovascular: Normal rate, regular rhythm and normal heart sounds.   No murmur heard. Respiratory: Effort normal and breath sounds normal.  GI: He exhibits no distension and no mass. There is no tenderness.  Musculoskeletal: He exhibits no edema.  Lymphadenopathy:    He has no cervical adenopathy.  Neurological: He is alert.  Skin: Skin is warm and dry.     Assessment/Plan Chronic GERD complicated by esophageal stricture and Barrett's. EGD, ED and biopsy from esophagus.  REHMAN,NAJEEB U 09/09/2012, 8:53 AM

## 2012-09-09 NOTE — Op Note (Signed)
EGD PROCEDURE REPORT  PATIENT:  Erik Reid  MR#:  [id number] Birthdate:  06-Mar-1971, 41 y.o., male Endoscopist:  Dr. Malissa Hippo, MD Referred By:  Dr. Alice Reichert, MD  Procedure Date: 09/09/2012  Procedure:   EGD with ED.  Indications:  Patient is 41 year old Caucasian male who presented 4 months ago with foreign body in esophagus. He underwent EGD with foreign body removal and dilation of distal esophageal stricture. She also had 6 cm long tubular Barrett's but biopsy was not taken because of inflammation. He is now returning for repeat EGD with ED and esophageal biopsy.            Informed Consent:  The risks, benefits, alternatives & imponderables which include, but are not limited to, bleeding, infection, perforation, drug reaction and potential missed lesion have been reviewed.  The potential for biopsy, lesion removal, esophageal dilation, etc. have also been discussed.  Questions have been answered.  All parties agreeable.  Please see history & physical in medical record for more information.  Medications:  Demerol 50 mg IV Versed 10 mg IV Cetacaine spray topically for oropharyngeal anesthesia  Description of procedure:  The endoscope was introduced through the mouth and advanced to the second portion of the duodenum without difficulty or limitations. The mucosal surfaces were surveyed very carefully during advancement of the scope and upon withdrawal.  Findings:  Esophagus:  Mucosa of the proximal and middle segment was normal. Proximal margin of salmon-colored mucosa was at 32 cm from the incisors. Few islands of squamous epithelium noted in the background of salmon-colored mucosa. Soft stricture at GE junction. GEJ:  38 cm Hiatus:  40 cm Stomach:  Stomach was empty and distended very well with insufflation. Folds in the proximal stomach were normal. Examination of mucosa at body, antrum, pyloric channel, angularis, fundus and cardia was normal. Duodenum:  Normal  bulbar and post bulbar mucosa.  Therapeutic/Diagnostic Maneuvers Performed:  Stricture at GE junction was dilated with a balloon dilator. Balloon dilator was passed through the scope. Guidewire was pushed into gastric lumen. Balloon dilator was positioned across stricture and insufflated to a diameter of 18 mm and maintained for few minutes and then passed distally. No mucosal disruption noted. Balloon dilator was deflated and withdrawn. Biopsy was then taken from distal and proximal Barrett's and submitted separately.    Complications:  None  Impression: 6 cm long tubular Barrett's with proximal margin at 32 cm from the incisors. Biopsy taken from proximal and distal segment for histologic confirmation and to rule out dysplasia. Soft stricture at GE junction dilated to 18 mm the balloon. Small sliding hiatal hernia. GE junction at 38 cm from the incisors and hiatus at 40.  Recommendations:  Continue antireflux measures and pantoprazole as before. I will contact patient with biopsy results. Office visit in one year.  Marykathleen Russi U  09/09/2012  9:21 AM  CC: Dr. Alice Reichert, MD & Dr. Bonnetta Barry ref. provider found

## 2012-09-13 ENCOUNTER — Encounter (HOSPITAL_COMMUNITY): Payer: Self-pay | Admitting: Internal Medicine

## 2012-09-16 ENCOUNTER — Encounter (INDEPENDENT_AMBULATORY_CARE_PROVIDER_SITE_OTHER): Payer: Self-pay | Admitting: *Deleted

## 2013-09-05 ENCOUNTER — Encounter (INDEPENDENT_AMBULATORY_CARE_PROVIDER_SITE_OTHER): Payer: Self-pay | Admitting: *Deleted

## 2013-09-08 ENCOUNTER — Other Ambulatory Visit (INDEPENDENT_AMBULATORY_CARE_PROVIDER_SITE_OTHER): Payer: Self-pay | Admitting: Internal Medicine

## 2013-09-08 DIAGNOSIS — K219 Gastro-esophageal reflux disease without esophagitis: Secondary | ICD-10-CM

## 2013-09-18 ENCOUNTER — Ambulatory Visit (INDEPENDENT_AMBULATORY_CARE_PROVIDER_SITE_OTHER): Payer: BC Managed Care – PPO | Admitting: Internal Medicine

## 2013-10-27 ENCOUNTER — Ambulatory Visit (INDEPENDENT_AMBULATORY_CARE_PROVIDER_SITE_OTHER): Payer: BC Managed Care – PPO | Admitting: Internal Medicine

## 2014-02-16 ENCOUNTER — Ambulatory Visit (INDEPENDENT_AMBULATORY_CARE_PROVIDER_SITE_OTHER): Payer: BC Managed Care – PPO | Admitting: Internal Medicine

## 2014-04-21 ENCOUNTER — Encounter (HOSPITAL_COMMUNITY): Payer: Self-pay | Admitting: Emergency Medicine

## 2014-04-21 ENCOUNTER — Emergency Department (HOSPITAL_COMMUNITY)
Admission: EM | Admit: 2014-04-21 | Discharge: 2014-04-21 | Disposition: A | Discharge status: Home / Self Care | Payer: 59 | Attending: Emergency Medicine | Admitting: Emergency Medicine

## 2014-04-21 ENCOUNTER — Emergency Department (HOSPITAL_COMMUNITY): Payer: 59

## 2014-04-21 DIAGNOSIS — S99929A Unspecified injury of unspecified foot, initial encounter: Principal | ICD-10-CM

## 2014-04-21 DIAGNOSIS — Z79899 Other long term (current) drug therapy: Secondary | ICD-10-CM | POA: Insufficient documentation

## 2014-04-21 DIAGNOSIS — S8990XA Unspecified injury of unspecified lower leg, initial encounter: Secondary | ICD-10-CM | POA: Insufficient documentation

## 2014-04-21 DIAGNOSIS — Y929 Unspecified place or not applicable: Secondary | ICD-10-CM | POA: Insufficient documentation

## 2014-04-21 DIAGNOSIS — Y9389 Activity, other specified: Secondary | ICD-10-CM | POA: Insufficient documentation

## 2014-04-21 DIAGNOSIS — Z8719 Personal history of other diseases of the digestive system: Secondary | ICD-10-CM | POA: Insufficient documentation

## 2014-04-21 DIAGNOSIS — M25561 Pain in right knee: Secondary | ICD-10-CM

## 2014-04-21 DIAGNOSIS — M109 Gout, unspecified: Secondary | ICD-10-CM | POA: Insufficient documentation

## 2014-04-21 DIAGNOSIS — S99919A Unspecified injury of unspecified ankle, initial encounter: Principal | ICD-10-CM

## 2014-04-21 DIAGNOSIS — W172XXA Fall into hole, initial encounter: Secondary | ICD-10-CM | POA: Insufficient documentation

## 2014-04-21 MED ORDER — NAPROXEN 500 MG PO TABS: 500.0000 mg | ORAL_TABLET | Freq: Two times a day (BID) | ORAL | Status: DC

## 2014-04-21 MED ORDER — HYDROCODONE-ACETAMINOPHEN 7.5-325 MG PO TABS: 1.0000 | ORAL_TABLET | Freq: Four times a day (QID) | ORAL | Status: DC | PRN

## 2014-04-21 NOTE — ED Notes (Signed)
Pt c/o right knee pain and swelling that started two weeks ago after stepping in a hole, cms intact distal,

## 2014-04-21 NOTE — ED Provider Notes (Signed)
CSN: [id number]     Arrival date & time 04/21/14  0813 History   First MD Initiated Contact with Patient 04/21/14 0840     Chief Complaint  Patient presents with  . Knee Pain     (Consider location/radiation/quality/duration/timing/severity/associated sxs/prior Treatment) Patient is a 43 y.o. male presenting with knee pain. The history is provided by the patient.  Knee Pain Location:  Knee Time since incident:  2 weeks Injury: yes   Mechanism of injury comment:  Pt reports hx of previous right knee pain with symptoms worsening after a twisting injury agter stepping in a hole.   Knee location:  R knee Pain details:    Quality:  Aching   Radiates to:  Does not radiate   Severity:  Moderate   Onset quality:  Gradual   Duration:  2 weeks   Timing:  Constant   Progression:  Worsening Chronicity:  Recurrent Dislocation: no   Foreign body present:  No foreign bodies Prior injury to area:  Yes Relieved by:  Rest Worsened by:  Activity and bearing weight Ineffective treatments:  None tried Associated symptoms: swelling   Associated symptoms: no back pain, no decreased ROM, no fever, no itching, no muscle weakness, no neck pain, no numbness, no stiffness and no tingling     Past Medical History  Diagnosis Date  . Gout   . GERD (gastroesophageal reflux disease)   . Dysphagia   . Gout    Past Surgical History  Procedure Laterality Date  . Back surgery    . Esophagogastroduodenoscopy  05/08/2012    Procedure: ESOPHAGOGASTRODUODENOSCOPY (EGD);  Surgeon: Malissa Hippo, MD;  Location: [facility];  Service: Endoscopy;  Laterality: N/A;  . Foreign body removal  05/08/2012    Procedure: FOREIGN BODY REMOVAL;  Surgeon: Malissa Hippo, MD;  Location: [facility];  Service: Endoscopy;  Laterality: N/A;  . Esophageal dilation  05/08/2012    Procedure: ESOPHAGEAL DILATION;  Surgeon: Malissa Hippo, MD;  Location: [facility];  Service: Endoscopy;  Laterality: N/A;  . Esophageal  biopsy  09/09/2012    Procedure: BIOPSY;  Surgeon: Malissa Hippo, MD;  Location: [facility];  Service: Endoscopy;  Laterality: N/A;   Family History  Problem Relation Age of Onset  . Healthy Sister   . Gout Brother   . Gout Daughter   . Gout Daughter   . Gout Son    History  Substance Use Topics  . Smoking status: Never Smoker   . Smokeless tobacco: Current User    Types: Snuff  . Alcohol Use: 7.2 oz/week    12 Cans of beer per week    Review of Systems  Constitutional: Negative for fever and chills.  Genitourinary: Negative for dysuria and difficulty urinating.  Musculoskeletal: Positive for arthralgias and joint swelling. Negative for back pain, neck pain and stiffness.  Skin: Negative for color change, itching and wound.  All other systems reviewed and are negative.     Allergies  Review of patient's allergies indicates no known allergies.  Home Medications   Prior to Admission medications   Medication Sig Start Date End Date Taking? Authorizing Provider  febuxostat (ULORIC) 40 MG tablet Take 40 mg by mouth daily.   Yes Historical Provider, MD   BP 146/86  Pulse 62  Temp(Src) 98.3 F (36.8 C) (Oral)  Resp 20  SpO2 100% Physical Exam  Nursing note and vitals reviewed. Constitutional: He is oriented to person, place, and time. He  appears well-developed and well-nourished. No distress.  HENT:  Head: Normocephalic and atraumatic.  Cardiovascular: Normal rate, regular rhythm, normal heart sounds and intact distal pulses.   Pulmonary/Chest: Effort normal and breath sounds normal. No respiratory distress.  Musculoskeletal: He exhibits edema and tenderness.  ttp of the anterior right knee.  No erythema, effusion, or step-off deformity.  DP pulse brisk, distal sensation intact. Calf is soft and NT. compartments of right leg are soft.    Neurological: He is alert and oriented to person, place, and time. He exhibits normal muscle tone. Coordination normal.  Skin:  Skin is warm and dry. No erythema.    ED Course  Procedures (including critical care time) Labs Review Labs Reviewed - No data to display  Imaging Review Dg Knee Complete 4 Views Right  04/21/2014   CLINICAL DATA:  Twisted right knee.  Pain.  EXAM: RIGHT KNEE - COMPLETE 4+ VIEW  COMPARISON:  None.  FINDINGS: There is no evidence of fracture, dislocation, or joint effusion. Medial compartment narrowing, marginal spur formation in sharp in the tibial spines noted compatible with degenerative joint disease. Soft tissues are unremarkable.  IMPRESSION: 1.  No acute findings.  2.  Osteoarthritis.   Electronically Signed   By: Signa Kellaylor  Stroud M.D.   On: 04/21/2014 10:01     EKG Interpretation None      MDM   Final diagnoses:  Knee pain, right    XR results discussed with patient.  ACE wrap applied.  No concerning sx's for septic joint.  No pain or edema distally.  NV intact.    Pt agrees to symptomatic treatment with naprosyn and hydrocodone .  Referral given for Dr. Romeo AppleHArrison.  Pt appears stable for d/c    Erik Pennella L. Dagoberto Nealy, PA-C 04/22/14 1322

## 2014-04-21 NOTE — Discharge Instructions (Signed)
Knee Pain Knee pain can be a result of an injury or other medical conditions. Treatment will depend on the cause of your pain. HOME CARE  Only take medicine as told by your doctor.  Keep a healthy weight. Being overweight can make the knee hurt more.  Stretch before exercising or playing sports.  If there is constant knee pain, change the way you exercise. Ask your doctor for advice.  Make sure shoes fit well. Choose the right shoe for the sport or activity.  Protect your knees. Wear kneepads if needed.  Rest when you are tired. GET HELP RIGHT AWAY IF:   Your knee pain does not stop.  Your knee pain does not get better.  Your knee joint feels hot to the touch.  You have a fever. MAKE SURE YOU:   Understand these instructions.  Will watch this condition.  Will get help right away if you are not doing well or get worse. Document Released: 02/05/2009 Document Revised: 02/01/2012 Document Reviewed: 02/05/2009 [employer] Patient Information 2014 [employer].  

## 2014-04-30 NOTE — ED Provider Notes (Signed)
Medical screening examination/treatment/procedure(s) were performed by non-physician practitioner and as supervising physician I was immediately available for consultation/collaboration.   EKG Interpretation None       Donnetta Hutching, MD 04/30/14 [phone]

## 2015-01-24 ENCOUNTER — Emergency Department (HOSPITAL_COMMUNITY)
Admission: EM | Admit: 2015-01-24 | Discharge: 2015-01-24 | Disposition: A | Discharge status: Home / Self Care | Payer: 59 | Attending: Emergency Medicine | Admitting: Emergency Medicine

## 2015-01-24 ENCOUNTER — Encounter (HOSPITAL_COMMUNITY): Payer: Self-pay | Admitting: Emergency Medicine

## 2015-01-24 ENCOUNTER — Emergency Department (HOSPITAL_COMMUNITY): Payer: 59

## 2015-01-24 DIAGNOSIS — Z79899 Other long term (current) drug therapy: Secondary | ICD-10-CM | POA: Diagnosis not present

## 2015-01-24 DIAGNOSIS — K219 Gastro-esophageal reflux disease without esophagitis: Secondary | ICD-10-CM | POA: Insufficient documentation

## 2015-01-24 DIAGNOSIS — R079 Chest pain, unspecified: Secondary | ICD-10-CM | POA: Diagnosis present

## 2015-01-24 DIAGNOSIS — M109 Gout, unspecified: Secondary | ICD-10-CM | POA: Diagnosis not present

## 2015-01-24 LAB — CBC
HCT: 43.6 % (ref 39.0–52.0)
HEMOGLOBIN: 14.5 g/dL (ref 13.0–17.0)
MCH: 29.9 pg (ref 26.0–34.0)
MCHC: 33.3 g/dL (ref 30.0–36.0)
MCV: 89.9 fL (ref 78.0–100.0)
PLATELETS: 274 10*3/uL (ref 150–400)
RBC: 4.85 MIL/uL (ref 4.22–5.81)
RDW: 13.4 % (ref 11.5–15.5)
WBC: 6 10*3/uL (ref 4.0–10.5)

## 2015-01-24 LAB — BASIC METABOLIC PANEL
ANION GAP: 9 (ref 5–15)
BUN: 21 mg/dL (ref 6–23)
CHLORIDE: 106 mmol/L (ref 96–112)
CO2: 21 mmol/L (ref 19–32)
CREATININE: 1.1 mg/dL (ref 0.50–1.35)
Calcium: 8.7 mg/dL (ref 8.4–10.5)
GFR calc non Af Amer: 81 mL/min — ABNORMAL LOW (ref >90)
Glucose, Bld: 110 mg/dL — ABNORMAL HIGH (ref 70–99)
Potassium: 3.5 mmol/L (ref 3.5–5.1)
Sodium: 136 mmol/L (ref 135–145)

## 2015-01-24 LAB — TROPONIN I

## 2015-01-24 MED ORDER — NAPROXEN 500 MG PO TABS: 500.0000 mg | ORAL_TABLET | Freq: Two times a day (BID) | ORAL | Status: DC

## 2015-01-24 NOTE — Discharge Instructions (Signed)
Tests were normal. Follow-up your primary care doctor. °

## 2015-01-24 NOTE — ED Provider Notes (Signed)
CSN: [id number]     Arrival date & time 01/24/15  0826 History   This chart was scribed for Erik Hutching, MD by Abel Presto, ED Scribe. This patient was seen in room [facility] and the patient's care was started at 8:58 AM.    Chief Complaint  Patient presents with  . Chest Pain      Patient is a 44 y.o. male presenting with chest pain. The history is provided by the patient. No language interpreter was used.  Chest Pain  HPI Comments: Erik Reid is a 44 y.o. male with PMHx of gout, GERD, and dysphagia who presents to the Emergency Department complaining of sharp stabbing sporadic central chest pain with onset a week ago. Pt notes associated soreness throughout chest and back, SOB, and nausea. Pt denies movement aggravating the pain, but notes pain with deep inspiration. Pt states he took 2 ASA PTA. Pt works as a Psychologist, occupational. Pt is not a smoker but reports EtOH use. Pt denies any known injury, diaphoresis, and vomiting. Pt's PCP is Dr. Renard Matter.   Past Medical History  Diagnosis Date  . Gout   . GERD (gastroesophageal reflux disease)   . Dysphagia   . Gout    Past Surgical History  Procedure Laterality Date  . Back surgery    . Esophagogastroduodenoscopy  05/08/2012    Procedure: ESOPHAGOGASTRODUODENOSCOPY (EGD);  Surgeon: Malissa Hippo, MD;  Location: [facility];  Service: Endoscopy;  Laterality: N/A;  . Foreign body removal  05/08/2012    Procedure: FOREIGN BODY REMOVAL;  Surgeon: Malissa Hippo, MD;  Location: [facility];  Service: Endoscopy;  Laterality: N/A;  . Esophageal dilation  05/08/2012    Procedure: ESOPHAGEAL DILATION;  Surgeon: Malissa Hippo, MD;  Location: [facility];  Service: Endoscopy;  Laterality: N/A;  . Esophageal biopsy  09/09/2012    Procedure: BIOPSY;  Surgeon: Malissa Hippo, MD;  Location: [facility];  Service: Endoscopy;  Laterality: N/A;   Family History  Problem Relation Age of Onset  . Healthy Sister   . Gout Brother   . Gout Daughter    . Gout Daughter   . Gout Son    History  Substance Use Topics  . Smoking status: Never Smoker   . Smokeless tobacco: Current User    Types: Snuff  . Alcohol Use: 7.2 oz/week    12 Cans of beer per week    Review of Systems  Cardiovascular: Positive for chest pain.   A complete 10 system review of systems was obtained and all systems are negative except as noted in the HPI and PMH.     Allergies  Review of patient's allergies indicates no known allergies.  Home Medications   Prior to Admission medications   Medication Sig Start Date End Date Taking? Authorizing Provider  colchicine 0.6 MG tablet Take 1 tablet by mouth daily as needed. 12/19/14  Yes Historical Provider, MD  febuxostat (ULORIC) 40 MG tablet Take 40 mg by mouth daily.   Yes Historical Provider, MD  ibuprofen (ADVIL,MOTRIN) 800 MG tablet Take 800 mg by mouth 3 (three) times daily as needed. 01/15/15  Yes Historical Provider, MD  pantoprazole (PROTONIX) 40 MG tablet Take 1 tablet by mouth daily. 01/14/15  Yes Historical Provider, MD  HYDROcodone-acetaminophen (NORCO) 7.5-325 MG per tablet Take 1 tablet by mouth every 6 (six) hours as needed for moderate pain. Patient not taking: Reported on 01/24/2015 04/21/14   Tammy L. Triplett, PA-C  naproxen (  NAPROSYN) 500 MG tablet Take 1 tablet (500 mg total) by mouth 2 (two) times daily. Take with food Patient not taking: Reported on 01/24/2015 04/21/14   Tammy L. Triplett, PA-C   BP 118/77 mmHg  Pulse 59  Temp(Src) 97.6 F (36.4 C) (Oral)  Resp 10  Ht 6' (1.829 m)  Wt 238 lb (107.956 kg)  BMI 32.27 kg/m2  SpO2 99% Physical Exam  Constitutional: He is oriented to person, place, and time. He appears well-developed and well-nourished.  HENT:  Head: Normocephalic and atraumatic.  Eyes: Conjunctivae and EOM are normal. Pupils are equal, round, and reactive to light.  Neck: Normal range of motion. Neck supple.  Cardiovascular: Normal rate and regular rhythm.   Pulmonary/Chest:  Effort normal and breath sounds normal. He exhibits tenderness (anterior).  Abdominal: Soft. Bowel sounds are normal.  Musculoskeletal: Normal range of motion.  Neurological: He is alert and oriented to person, place, and time.  Skin: Skin is warm and dry.  Psychiatric: He has a normal mood and affect. His behavior is normal.  Nursing note and vitals reviewed.   ED Course  Procedures (including critical care time) DIAGNOSTIC STUDIES: Oxygen Saturation is 100% on room air, normal by my interpretation.    COORDINATION OF CARE: 9:04 AM Discussed treatment plan with patient at beside, the patient agrees with the plan and has no further questions at this time.   Labs Review Labs Reviewed  BASIC METABOLIC PANEL - Abnormal; Notable for the following:    Glucose, Bld 110 (*)    GFR calc non Af Amer 81 (*)    All other components within normal limits  CBC  TROPONIN I    Imaging Review Dg Chest 2 View  01/24/2015   CLINICAL DATA:  One week history of chest pain  EXAM: CHEST  2 VIEW  COMPARISON:  May 08, 2012  FINDINGS: There is no edema or consolidation. The heart size and pulmonary vascularity are within normal limits. No adenopathy. No pneumothorax. No bone lesions.  IMPRESSION: No edema or consolidation.   Electronically Signed   By: Bretta BangWilliam  Woodruff III M.D.   On: 01/24/2015 09:02     EKG Interpretation   Date/Time:  Thursday January 24 2015 08:32:26 EST Ventricular Rate:  71 PR Interval:  176 QRS Duration: 96 QT Interval:  377 QTC Calculation: 410 R Axis:   78 Text Interpretation:  Sinus rhythm Baseline wander in lead(s) V5 V6  Confirmed by Adriana SimasOOK  MD, Deran Barro ([id number]) on 01/24/2015 8:46:31 AM      Results for orders placed or performed during the hospital encounter of 01/24/15  CBC  Result Value Ref Range   WBC 6.0 4.0 - 10.5 K/uL   RBC 4.85 4.22 - 5.81 MIL/uL   Hemoglobin 14.5 13.0 - 17.0 g/dL   HCT 46.943.6 62.939.0 - 52.852.0 %   MCV 89.9 78.0 - 100.0 fL   MCH 29.9 26.0 - 34.0 pg    MCHC 33.3 30.0 - 36.0 g/dL   RDW 41.313.4 24.411.5 - 01.015.5 %   Platelets 274 150 - 400 K/uL  Basic metabolic panel  Result Value Ref Range   Sodium 136 135 - 145 mmol/L   Potassium 3.5 3.5 - 5.1 mmol/L   Chloride 106 96 - 112 mmol/L   CO2 21 19 - 32 mmol/L   Glucose, Bld 110 (H) 70 - 99 mg/dL   BUN 21 6 - 23 mg/dL   Creatinine, Ser 2.721.10 0.50 - 1.35 mg/dL   Calcium 8.7 8.4 -  10.5 mg/dL   GFR calc non Af Amer 81 (L) >90 mL/min   GFR calc Af Amer >90 >90 mL/min   Anion gap 9 5 - 15  Troponin I ([facility])  Result Value Ref Range   Troponin I <0.03 <0.031 ng/mL   Dg Chest 2 View  01/24/2015   CLINICAL DATA:  One week history of chest pain  EXAM: CHEST  2 VIEW  COMPARISON:  May 08, 2012  FINDINGS: There is no edema or consolidation. The heart size and pulmonary vascularity are within normal limits. No adenopathy. No pneumothorax. No bone lesions.  IMPRESSION: No edema or consolidation.   Electronically Signed   By: Bretta Bang III M.D.   On: 01/24/2015 09:02    Date: 01/24/2015  Rate: 71  Rhythm: normal sinus rhythm  QRS Axis: normal  Intervals: normal  ST/T Wave abnormalities: normal  Conduction Disutrbances: none  Narrative Interpretation: unremarkable     MDM   Final diagnoses:  Chest pain, unspecified chest pain type    Patient is low risk for ACS or pulmonary embolism. Screening labs, EKG, chest x-ray, troponin all negative. Rx Naprosyn for pain.     I personally performed the services described in this documentation, which was scribed in my presence. The recorded information has been reviewed and is accurate.     Erik Hutching, MD 01/24/15 1230

## 2015-01-24 NOTE — ED Notes (Signed)
Dr. Adriana Simasook at [facility].

## 2015-01-24 NOTE — ED Notes (Signed)
Pt c/o intermittent cp x 1 week. States pain in stabbing in central chest and sore throughout chest and back.

## 2015-01-24 NOTE — ED Notes (Signed)
Pt reports took ASA prior to arrival.

## 2015-02-05 IMAGING — CR DG KNEE COMPLETE 4+V*R*
4 series · 4 of 4 positions shown · non-contrast
Comparison: None.

CLINICAL DATA: Twisted right knee.  Pain.

EXAM:
RIGHT KNEE - COMPLETE 4+ VIEW

[view not recorded (1 of 4)]
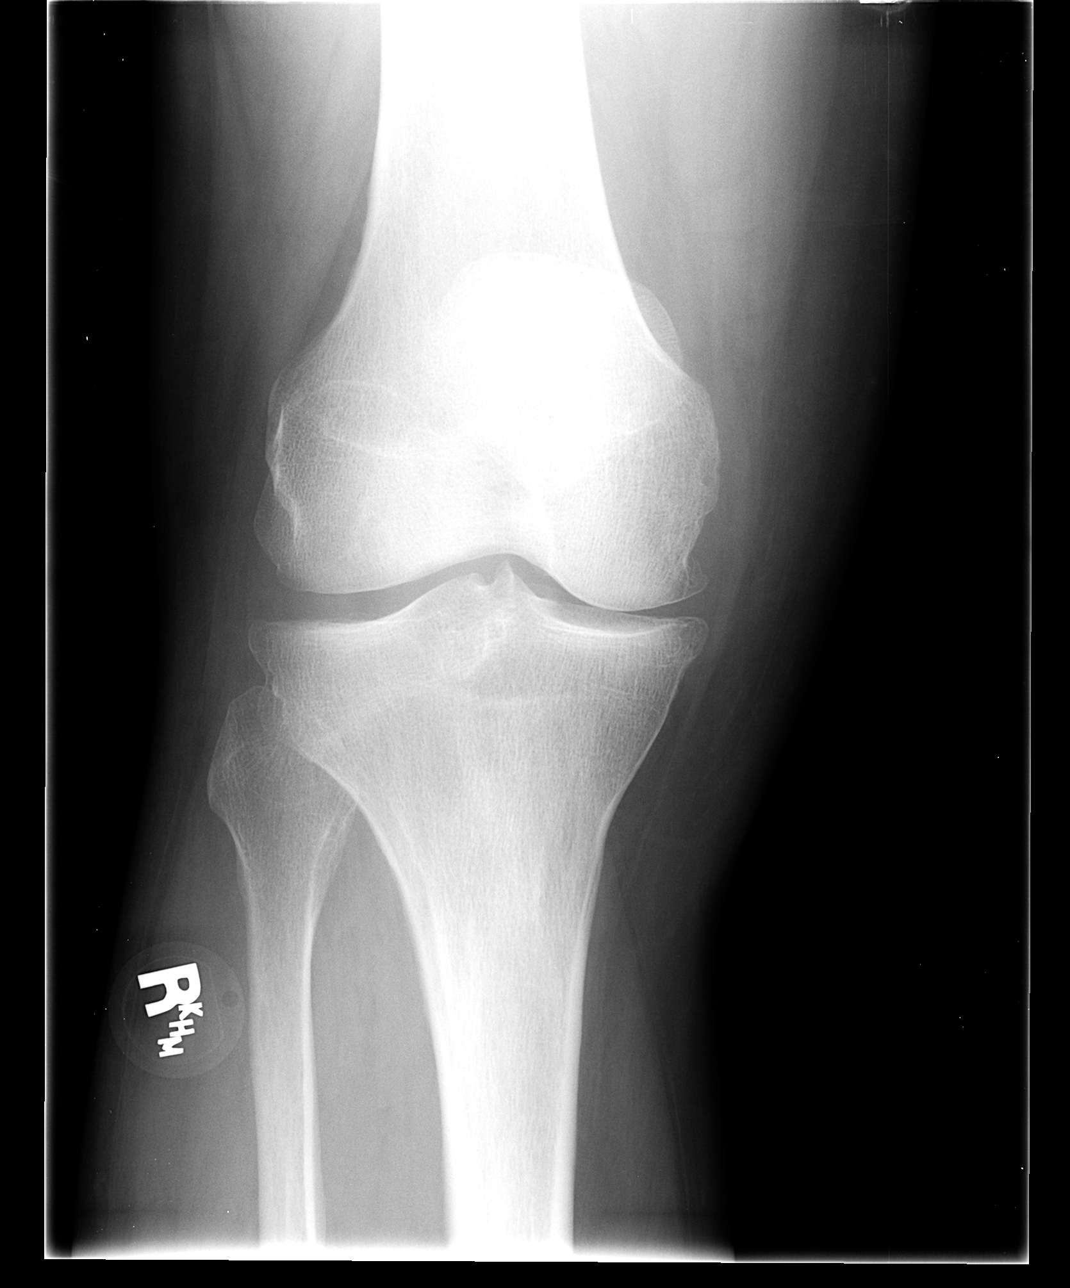

[view not recorded (2 of 4)]
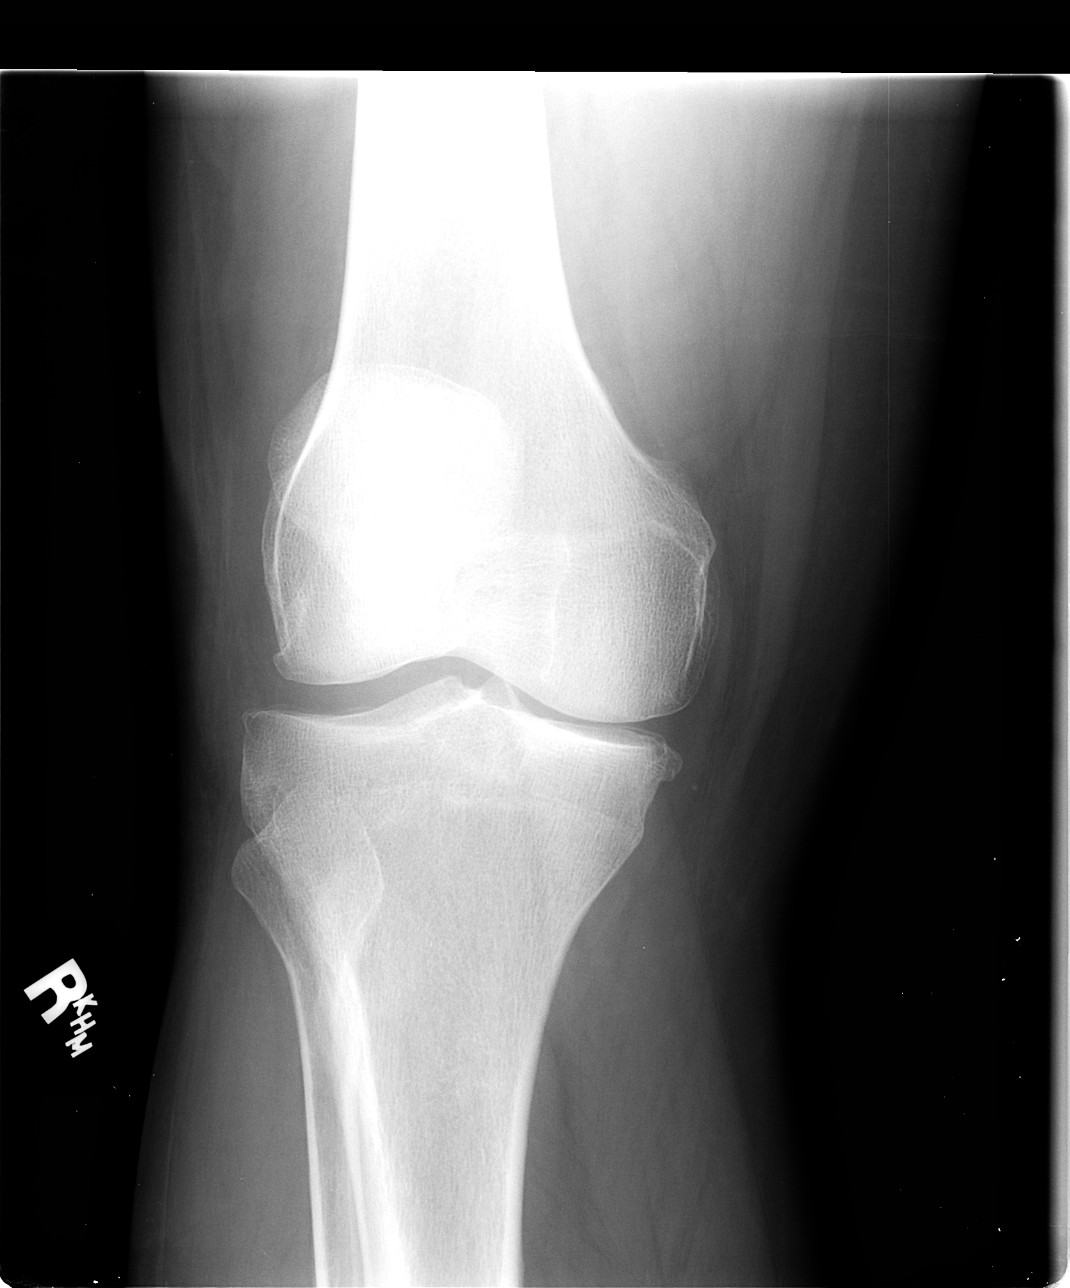

[view not recorded (3 of 4)]
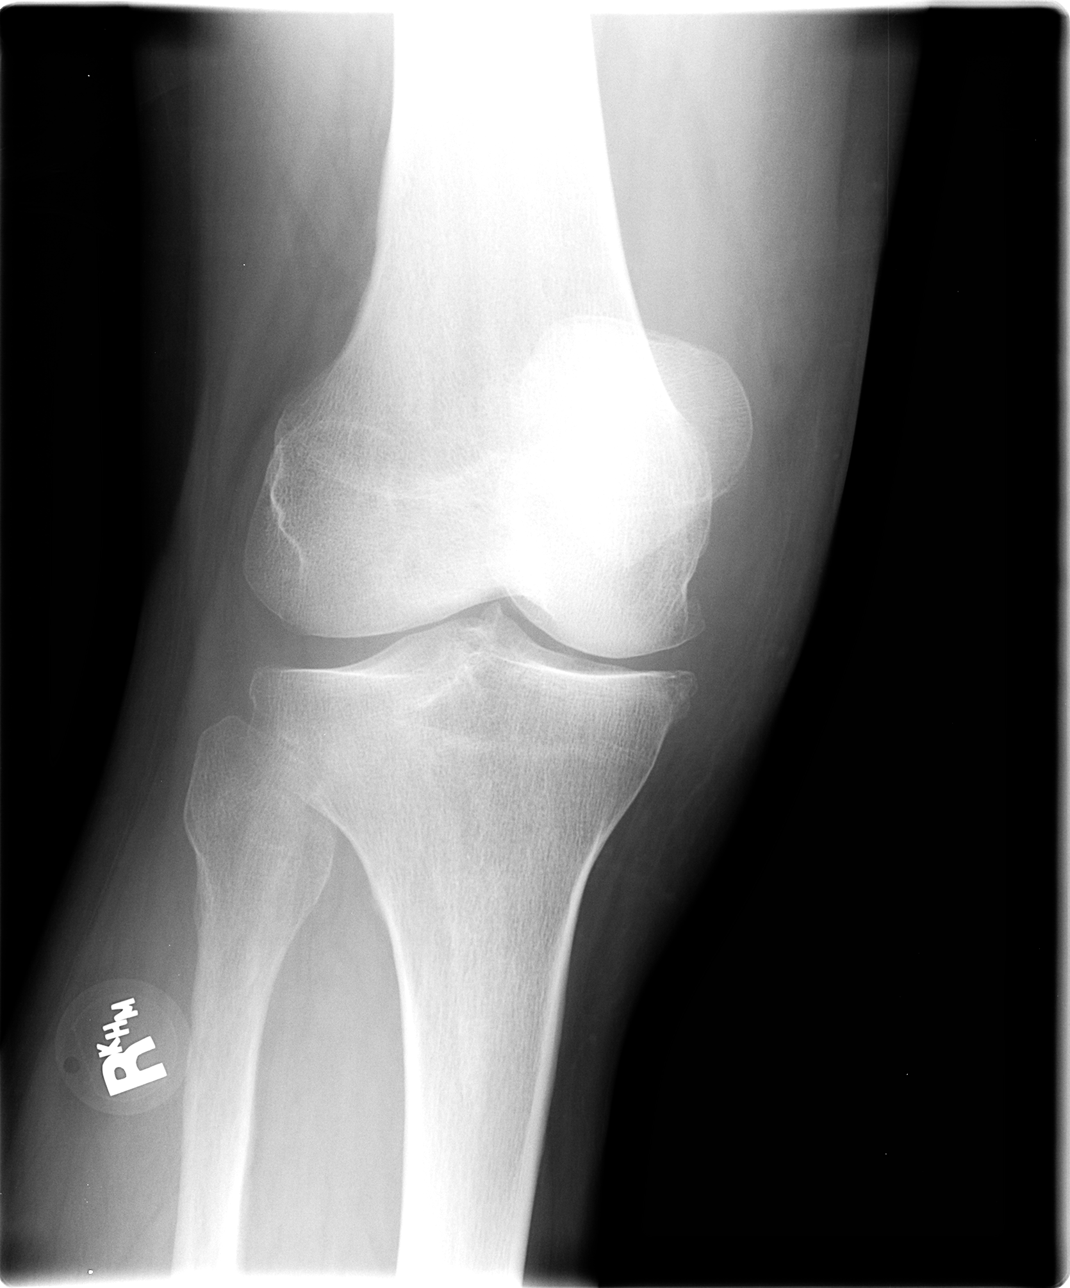

[view not recorded (4 of 4)]
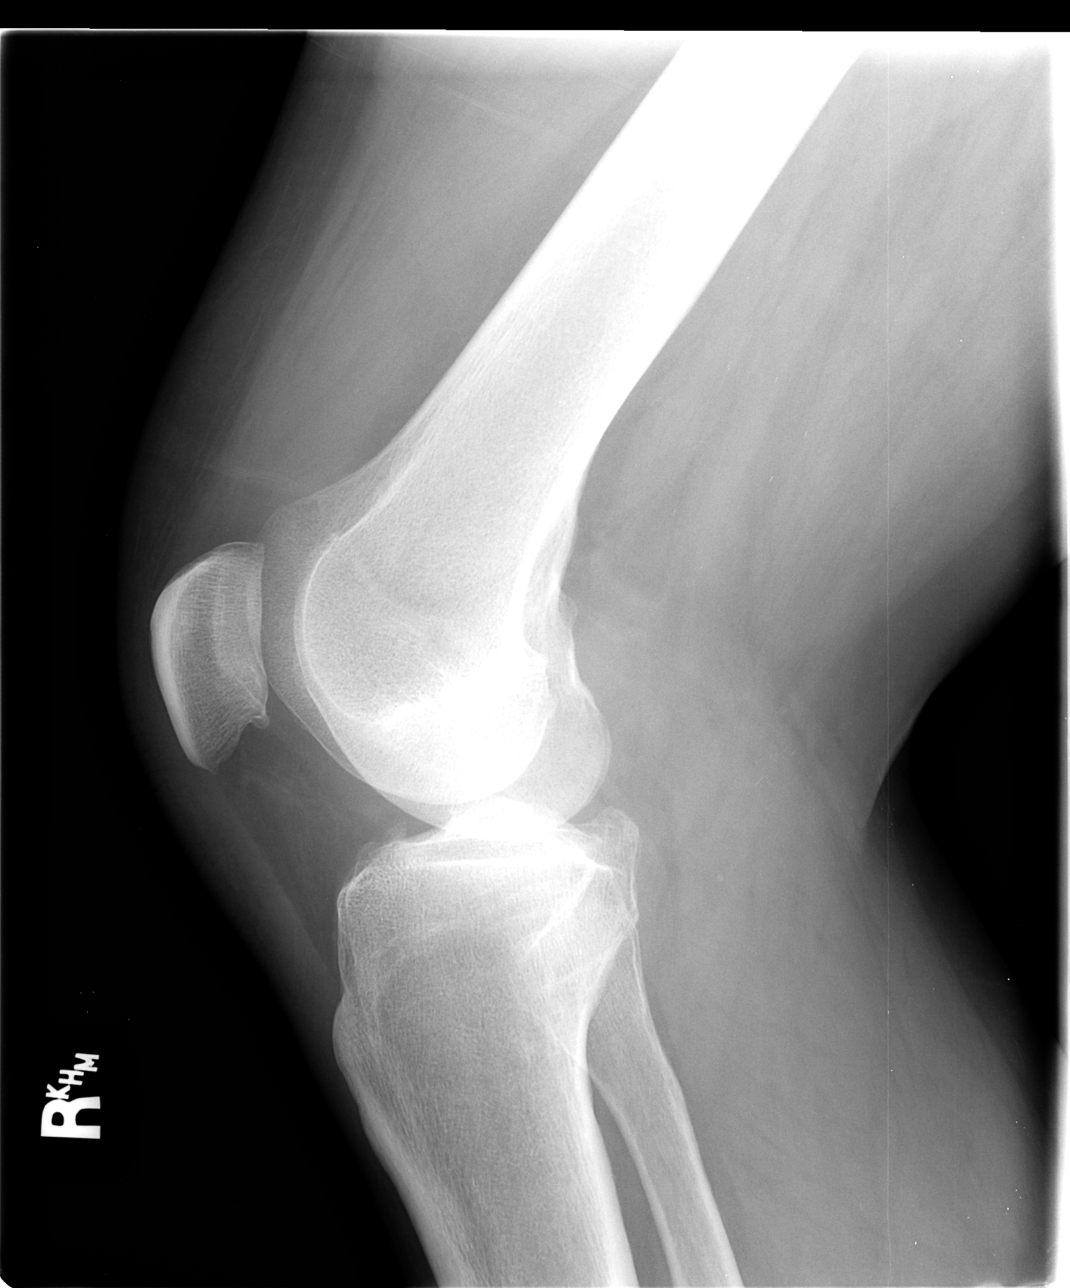

[4 of 4 positions shown; findings below may reference images not displayed]

FINDINGS: There is no evidence of fracture, dislocation, or joint effusion.
Medial compartment narrowing, marginal spur formation in sharp in
the tibial spines noted compatible with degenerative joint disease.
Soft tissues are unremarkable.
IMPRESSION: 1.  No acute findings.

2.  Osteoarthritis.

## 2016-02-20 ENCOUNTER — Other Ambulatory Visit: Payer: Self-pay | Admitting: Orthopaedic Surgery

## 2016-02-20 NOTE — Telephone Encounter (Signed)
Let him know I do not normally Rx this medicine.  Family doctors do it.

## 2016-06-25 ENCOUNTER — Emergency Department (HOSPITAL_COMMUNITY): Payer: 59

## 2016-06-25 ENCOUNTER — Encounter (HOSPITAL_COMMUNITY): Payer: Self-pay | Admitting: Emergency Medicine

## 2016-06-25 ENCOUNTER — Emergency Department (HOSPITAL_COMMUNITY)
Admission: EM | Admit: 2016-06-25 | Discharge: 2016-06-25 | Disposition: A | Discharge status: Home / Self Care | Payer: 59 | Attending: Emergency Medicine | Admitting: Emergency Medicine

## 2016-06-25 DIAGNOSIS — M79605 Pain in left leg: Secondary | ICD-10-CM | POA: Diagnosis present

## 2016-06-25 DIAGNOSIS — F17290 Nicotine dependence, other tobacco product, uncomplicated: Secondary | ICD-10-CM | POA: Insufficient documentation

## 2016-06-25 DIAGNOSIS — Z791 Long term (current) use of non-steroidal anti-inflammatories (NSAID): Secondary | ICD-10-CM | POA: Insufficient documentation

## 2016-06-25 DIAGNOSIS — M7989 Other specified soft tissue disorders: Secondary | ICD-10-CM | POA: Diagnosis not present

## 2016-06-25 DIAGNOSIS — Z79899 Other long term (current) drug therapy: Secondary | ICD-10-CM | POA: Insufficient documentation

## 2016-06-25 LAB — BASIC METABOLIC PANEL
ANION GAP: 9 (ref 5–15)
BUN: 16 mg/dL (ref 6–20)
CALCIUM: 9.1 mg/dL (ref 8.9–10.3)
CO2: 24 mmol/L (ref 22–32)
Chloride: 104 mmol/L (ref 101–111)
Creatinine, Ser: 0.94 mg/dL (ref 0.61–1.24)
Glucose, Bld: 90 mg/dL (ref 65–99)
Potassium: 3.4 mmol/L — ABNORMAL LOW (ref 3.5–5.1)
Sodium: 137 mmol/L (ref 135–145)

## 2016-06-25 LAB — CBC WITH DIFFERENTIAL/PLATELET
BASOS ABS: 0 10*3/uL (ref 0.0–0.1)
BASOS PCT: 0 %
Eosinophils Absolute: 0.3 10*3/uL (ref 0.0–0.7)
Eosinophils Relative: 4 %
HEMATOCRIT: 38 % — AB (ref 39.0–52.0)
Hemoglobin: 12.6 g/dL — ABNORMAL LOW (ref 13.0–17.0)
Lymphocytes Relative: 30 %
Lymphs Abs: 2.1 10*3/uL (ref 0.7–4.0)
MCH: 30.1 pg (ref 26.0–34.0)
MCHC: 33.2 g/dL (ref 30.0–36.0)
MCV: 90.7 fL (ref 78.0–100.0)
MONO ABS: 0.4 10*3/uL (ref 0.1–1.0)
Monocytes Relative: 6 %
NEUTROS ABS: 4.3 10*3/uL (ref 1.7–7.7)
Neutrophils Relative %: 60 %
PLATELETS: 339 10*3/uL (ref 150–400)
RBC: 4.19 MIL/uL — ABNORMAL LOW (ref 4.22–5.81)
RDW: 13.4 % (ref 11.5–15.5)
WBC: 7.2 10*3/uL (ref 4.0–10.5)

## 2016-06-25 MED ORDER — SULFAMETHOXAZOLE-TRIMETHOPRIM 800-160 MG PO TABS: 1.0000 | ORAL_TABLET | Freq: Two times a day (BID) | ORAL | 0 refills | Status: AC

## 2016-06-25 NOTE — ED Notes (Signed)
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 States knee pain started two days ago with some erythema noted, pt took "gout breakout medicine" yesterday with pain relief. Pt has marked swelling noted to left ankle, denies injury or pain.

## 2016-06-25 NOTE — ED Notes (Signed)
Pt returned from xray at this time.

## 2016-06-25 NOTE — ED Triage Notes (Signed)
Pt with L. Leg pain. Has area just below L. Knee where he thinks something may have bit him. Swelling to L. Ankle and top of L. Foot. Pt with a history of Gout.

## 2016-06-25 NOTE — ED Provider Notes (Signed)
[facility] Note   CSN: [id number] Arrival date & time: 06/25/16  2105  First Provider Contact:  None       History   Chief Complaint Chief Complaint  Patient presents with  . Leg Pain    HPI Erik Reid is a 45 y.o. male who presents to the ED with left leg swelling. 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 The area got sore and then developed a pustule that he squeezed and drained. After that he noted swelling in the lower leg that has gotten worse. He has a hx of gout but states that this does not feel anything like gout. He denies fever or other problems.   The history is provided by the patient. No language interpreter was used.    Past Medical History:  Diagnosis Date  . Dysphagia   . GERD (gastroesophageal reflux disease)   . Gout   . Gout     Patient Active Problem List   Diagnosis Date Noted  . Dysphagia 08/23/2012  . GERD (gastroesophageal reflux disease) 08/23/2012  . Gout 08/23/2012  . Bronchial asthma 08/23/2012    Past Surgical History:  Procedure Laterality Date  . BACK SURGERY    . BIOPSY  09/09/2012   Procedure: BIOPSY;  Surgeon: Malissa Hippo, MD;  Location: [facility];  Service: Endoscopy;  Laterality: N/A;  . ESOPHAGEAL DILATION  05/08/2012   Procedure: ESOPHAGEAL DILATION;  Surgeon: Malissa Hippo, MD;  Location: [facility];  Service: Endoscopy;  Laterality: N/A;  . ESOPHAGOGASTRODUODENOSCOPY  05/08/2012   Procedure: ESOPHAGOGASTRODUODENOSCOPY (EGD);  Surgeon: Malissa Hippo, MD;  [facility];  Service: Endoscopy;  Laterality: N/A;  . FOREIGN BODY REMOVAL  05/08/2012   Procedure: FOREIGN BODY REMOVAL;  Surgeon: Malissa Hippo, MD;  [facility]: [facility];  Service: Endoscopy;  Laterality: N/A;       Home Medications    Prior to Admission medications   Medication Sig Start Date End Date Taking? Authorizing Provider  pantoprazole (PROTONIX) 40 MG  tablet Take 1 tablet by mouth daily. 01/14/15  Yes Historical Provider, MD  ULORIC 80 MG TABS Take 80 mg by mouth daily. 06/02/16  Yes Historical Provider, MD  HYDROcodone-acetaminophen (NORCO) 7.5-325 MG per tablet Take 1 tablet by mouth every 6 (six) hours as needed for moderate pain. Patient not taking: Reported on 01/24/2015 04/21/14   Tammy Triplett, PA-C  ibuprofen (ADVIL,MOTRIN) 800 MG tablet Take 800 mg by mouth 3 (three) times daily as needed for mild pain.  01/15/15   Historical Provider, MD  indomethacin (INDOCIN) 50 MG capsule Take 50 mg by mouth 2 (two) times daily as needed for mild pain (when having a gout flare up).  04/07/16   Historical Provider, MD  naproxen (NAPROSYN) 500 MG tablet Take 1 tablet (500 mg total) by mouth 2 (two) times daily. Patient not taking: Reported on 06/25/2016 01/24/15   Donnetta Hutching, MD  sulfamethoxazole-trimethoprim (BACTRIM DS,SEPTRA DS) 800-160 MG tablet Take 1 tablet by mouth 2 (two) times daily. 06/25/16 07/02/16  Annaliz Aven Orlene Och, NP    Family History Family History  Problem Relation Age of Onset  . Healthy Sister   . Gout Brother   . Gout Daughter   . Gout Daughter   . Gout Son     Social History Social History  Substance Use Topics  . Smoking status: Never Smoker  . Smokeless  tobacco: Current User    Types: Snuff  . Alcohol use 7.2 oz/week    12 Cans of beer per week     Allergies   Review of patient's allergies indicates no known allergies.   Review of Systems Review of Systems  Constitutional: Negative for chills and fever.  HENT: Negative.   Respiratory: Negative for cough, chest tightness and shortness of breath.   Cardiovascular: Positive for leg swelling. Negative for chest pain.  Gastrointestinal: Negative for abdominal pain, nausea and vomiting.  Musculoskeletal: Positive for arthralgias.  Skin: Negative for rash.  Neurological: Negative for syncope and headaches.  Psychiatric/Behavioral: Negative for confusion. The patient is not  nervous/anxious.      Physical Exam Updated Vital Signs BP 135/78   Pulse 77   Temp 97.8 F (36.6 C) (Oral)   Resp 14   Ht 6' (1.829 m)   Wt 104.3 kg   SpO2 97%   BMI 31.19 kg/m   Physical Exam  Constitutional: He is oriented to person, place, and time. He appears well-developed and well-nourished.  HENT:  Head: Normocephalic and atraumatic.  Eyes: EOM are normal.  Neck: Neck supple.  Cardiovascular: Normal rate and regular rhythm.   Pulmonary/Chest: Effort normal and breath sounds normal.  Abdominal: Soft. There is no tenderness.  Musculoskeletal: He exhibits edema.  Left lower leg with edema, pedal pulse 2+, adequate circulation. No calf tenderness.  Neurological: He is alert and oriented to person, place, and time. No cranial nerve deficit.  Skin: Skin is warm and dry. No erythema (mild left lower extremity).  Nursing note and vitals reviewed.    ED Treatments / Results  Labs (all labs ordered are listed, but only abnormal results are displayed) Labs Reviewed  CBC WITH DIFFERENTIAL/PLATELET - Abnormal; Notable for the following:       Result Value   RBC 4.19 (*)    Hemoglobin 12.6 (*)    HCT 38.0 (*)    All other components within normal limits  BASIC METABOLIC PANEL - Abnormal; Notable for the following:    Potassium 3.4 (*)    All other components within normal limits    Radiology Dg Knee Complete 4 Views Left  Result Date: 06/25/2016 CLINICAL DATA:  Pain and swelling for 1 week, no injury. History of LEFT knee gout. EXAM: LEFT KNEE - COMPLETE 4+ VIEW COMPARISON:  None available for comparison at time of study interpretation. FINDINGS: Mild medial compartment joint space narrowing with marginal spurring. Minimal patellofemoral compartment marginal spurring. No acute fracture deformity. No dislocation. No destructive bony lesions. Soft tissue planes are nonsuspicious. IMPRESSION: No acute fracture deformity nor dislocation. Minimal to mild medial and  patellofemoral compartment osteoarthrosis. Electronically Signed   By: Awilda Metro M.D.   On: 06/25/2016 22:39    Procedures Procedures (including critical care time)  Medications Ordered in ED Medications - No data to display   Initial Impression / Assessment and Plan / ED Course  I have reviewed the triage vital signs and the nursing notes.  Pertinent labs & imaging results that were available during my care of the patient were reviewed by me and considered in my medical decision making (see chart for details).  Clinical Course   Dr. Estell Harpin in to examine the patient.   Final Clinical Impressions(s) / ED Diagnoses  45 y.o. male with left lower extremity swelling and mild erythema stable for d/c without concern for DVT at this time. Will start antibiotics and he will f/u with his PCP  or return here for worsening symptoms.  Final diagnoses:  Left leg pain  Swelling of left lower extremity    New Prescriptions Discharge Medication List as of 06/25/2016 11:20 PM    START taking these medications   Details  sulfamethoxazole-trimethoprim (BACTRIM DS,SEPTRA DS) 800-160 MG tablet Take 1 tablet by mouth 2 (two) times daily., Starting Thu 06/25/2016, Until Thu 07/02/2016, [address] [place], NP 06/26/16 1610    Bethann Berkshire, MD 06/27/16 0700

## 2016-06-25 NOTE — ED Notes (Signed)
Pt to xray at this time.

## 2020-01-09 NOTE — Patient Instructions (Addendum)
DUE TO COVID-19 ONLY ONE VISITOR IS ALLOWED TO COME WITH YOU AND STAY IN THE WAITING ROOM ONLY DURING PRE OP AND PROCEDURE DAY OF SURGERY. THE 1 VISITOR MAY VISIT WITH YOU AFTER SURGERY IN YOUR PRIVATE ROOM DURING VISITING HOURS ONLY!  YOU NEED TO HAVE A COVID 19 TEST ON: 01/11/20@  9:00 am     , THIS TEST MUST BE DONE BEFORE SURGERY, COME  [address], [place] , [address].  [facility]) ONCE YOUR COVID TEST IS COMPLETED, PLEASE BEGIN THE QUARANTINE INSTRUCTIONS AS OUTLINED IN YOUR HANDOUT.                Erik Reid     Your procedure is scheduled on: 01/15/20   Report to [facility] Main  Entrance   Report to SHORT STAY at: 5:30 AM     Call this number if you have problems the morning of surgery [phone]    Remember:    BRUSH YOUR TEETH MORNING OF SURGERY AND RINSE YOUR MOUTH OUT, NO CHEWING GUM CANDY OR MINTS.     Take these medicines the morning of surgery with A SIP OF WATER: pantoprazole,uloric                                You may not have any metal on your body including hair pins and              piercings  Do not wear jewelry, lotions, powders or perfumes, deodorant             Men may shave face and neck.   Do not bring valuables to the hospital. [facility] IS NOT             RESPONSIBLE   FOR VALUABLES.  Contacts, dentures or bridgework may not be worn into surgery.  Leave suitcase in the car. After surgery it may be brought to your room.     Patients discharged the day of surgery will not be allowed to drive home. IF YOU ARE HAVING SURGERY AND GOING HOME THE SAME DAY, YOU MUST HAVE AN ADULT TO DRIVE YOU HOME AND BE WITH YOU FOR 24 HOURS. YOU MAY GO HOME BY TAXI OR UBER OR ORTHERWISE, BUT AN ADULT MUST ACCOMPANY YOU HOME AND STAY WITH YOU FOR 24 HOURS.  Name and phone number of your driver:  Special Instructions: N/A              Please read over the following fact sheets you were  given: _____________________________________________________________________             NO SOLID FOOD AFTER MIDNIGHT THE NIGHT PRIOR TO SURGERY. NOTHING BY MOUTH EXCEPT CLEAR LIQUIDS UNTIL: 4:15 am . PLEASE FINISH ENSURE DRINK PER SURGEON ORDER  WHICH NEEDS TO BE COMPLETED AT: 4:15 am .   CLEAR LIQUID DIET   Foods Allowed                                                                     Foods Excluded  Coffee and tea, regular and decaf  liquids that you cannot  Plain Jell-O any favor except red or purple                                           see through such as: Fruit ices (not with fruit pulp)                                     milk, soups, orange juice  Iced Popsicles                                    All solid food Carbonated beverages, regular and diet                                    Cranberry, grape and apple juices Sports drinks like Gatorade Lightly seasoned clear broth or consume(fat free) Sugar, honey syrup  Sample Menu Breakfast                                Lunch                                     Supper Cranberry juice                    Beef broth                            Chicken broth Jell-O                                     Grape juice                           Apple juice Coffee or tea                        Jell-O                                      Popsicle                                                Coffee or tea                        Coffee or tea  _____________________________________________________________________  [facility] - Preparing for Surgery Before surgery, you can play an important role.  Because skin is not sterile, your skin needs to be as free of germs as possible.  You can reduce the number of germs on your skin by washing with CHG (chlorahexidine gluconate) soap before surgery.  CHG is an antiseptic cleaner which kills  germs and bonds with the skin to continue killing germs even after  washing. Please DO NOT use if you have an allergy to CHG or antibacterial soaps.  If your skin becomes reddened/irritated stop using the CHG and inform your nurse when you arrive at Short Stay. Do not shave (including legs and underarms) for at least 48 hours prior to the first CHG shower.  You may shave your face/neck. Please follow these instructions carefully:  1.  Shower with CHG Soap the night before surgery and the  morning of Surgery.  2.  If you choose to wash your hair, wash your hair first as usual with your  normal  shampoo.  3.  After you shampoo, rinse your hair and body thoroughly to remove the  shampoo.                           4.  Use CHG as you would any other liquid soap.  You can apply chg directly  to the skin and wash                       Gently with a scrungie or clean washcloth.  5.  Apply the CHG Soap to your body ONLY FROM THE NECK DOWN.   Do not use on face/ open                           Wound or open sores. Avoid contact with eyes, ears mouth and genitals (private parts).                       Wash face,  Genitals (private parts) with your normal soap.             6.  Wash thoroughly, paying special attention to the area where your surgery  will be performed.  7.  Thoroughly rinse your body with warm water from the neck down.  8.  DO NOT shower/wash with your normal soap after using and rinsing off  the CHG Soap.                9.  Pat yourself dry with a clean towel.            10.  Wear clean pajamas.            11.  Place clean sheets on your bed the night of your first shower and do not  sleep with pets. Day of Surgery : Do not apply any lotions/deodorants the morning of surgery.  Please wear clean clothes to the hospital/surgery center.  FAILURE TO FOLLOW THESE INSTRUCTIONS MAY RESULT IN THE CANCELLATION OF YOUR SURGERY PATIENT SIGNATURE_________________________________  NURSE  SIGNATURE__________________________________  ________________________________________________________________________    Erik Reid  An incentive spirometer is a tool that can help keep your lungs clear and active. This tool measures how well you are filling your lungs with each breath. Taking long deep breaths may help reverse or decrease the chance of developing breathing (pulmonary) problems (especially infection) following:  A long period of time when you are unable to move or be active. BEFORE THE PROCEDURE   If the spirometer includes an indicator to show your best effort, your nurse or respiratory therapist will set it to a desired goal.  If possible, sit up straight or lean slightly forward. Try not to slouch.  Hold the incentive spirometer in  an upright position. INSTRUCTIONS FOR USE  1. Sit on the edge of your bed if possible, or sit up as far as you can in bed or on a chair. 2. Hold the incentive spirometer in an upright position. 3. Breathe out normally. 4. Place the mouthpiece in your mouth and seal your lips tightly around it. 5. Breathe in slowly and as deeply as possible, raising the piston or the ball toward the top of the column. 6. Hold your breath for 3-5 seconds or for as long as possible. Allow the piston or ball to fall to the bottom of the column. 7. Remove the mouthpiece from your mouth and breathe out normally. 8. Rest for a few seconds and repeat Steps 1 through 7 at least 10 times every 1-2 hours when you are awake. Take your time and take a few normal breaths between deep breaths. 9. The spirometer may include an indicator to show your best effort. Use the indicator as a goal to work toward during each repetition. 10. After each set of 10 deep breaths, practice coughing to be sure your lungs are clear. If you have an incision (the cut made at the time of surgery), support your incision when coughing by placing a pillow or rolled up towels firmly  against it. Once you are able to get out of bed, walk around indoors and cough well. You may stop using the incentive spirometer when instructed by your caregiver.  RISKS AND COMPLICATIONS  Take your time so you do not get dizzy or light-headed.  If you are in pain, you may need to take or ask for pain medication before doing incentive spirometry. It is harder to take a deep breath if you are having pain. AFTER USE  Rest and breathe slowly and easily.  It can be helpful to keep track of a log of your progress. Your caregiver can provide you with a simple table to help with this. If you are using the spirometer at home, follow these instructions: Erik Reid IF:   You are having difficultly using the spirometer.  You have trouble using the spirometer as often as instructed.  Your pain medication is not giving enough relief while using the spirometer.  You develop fever of 100.5 F (38.1 C) or higher. SEEK IMMEDIATE MEDICAL CARE IF:   You cough up bloody sputum that had not been present before.  You develop fever of 102 F (38.9 C) or greater.  You develop worsening pain at or near the incision site. MAKE SURE YOU:   Understand these instructions.  Will watch your condition.  Will get help right away if you are not doing well or get worse. Document Released: 03/22/2007 Document Revised: 02/01/2012 Document Reviewed: 05/23/2007 [facility] Patient Information 2014 [place], [place].   ________________________________________________________________________

## 2020-01-09 NOTE — H&P (Signed)
TOTAL KNEE ADMISSION H&P  Patient is being admitted for right total knee arthroplasty.  Subjective:  Chief Complaint:right knee pain.  HPI: Erik Reid, 49 y.o. male, has a history of pain and functional disability in the right knee due to arthritis and has failed non-surgical conservative treatments for greater than 12 weeks to includeNSAID's and/or analgesics, corticosteriod injections, flexibility and strengthening excercises and activity modification.  Onset of symptoms was gradual, starting 5 years ago with gradually worsening course since that time. The patient noted no past surgery on the right knee(s).  Patient currently rates pain in the right knee(s) at 7 out of 10 with activity. Patient has night pain, worsening of pain with activity and weight bearing, pain that interferes with activities of daily living, pain with passive range of motion, crepitus and joint swelling.  Patient has evidence of periarticular osteophytes and joint space narrowing by imaging studies. There is no active infection.  Patient Active Problem List   Diagnosis Date Noted  . Dysphagia 08/23/2012  . GERD (gastroesophageal reflux disease) 08/23/2012  . Gout 08/23/2012  . Bronchial asthma 08/23/2012   Past Medical History:  Diagnosis Date  . Dysphagia   . GERD (gastroesophageal reflux disease)   . Gout   . Gout     Past Surgical History:  Procedure Laterality Date  . BACK SURGERY    . BIOPSY  09/09/2012   Procedure: BIOPSY;  Surgeon: Malissa Hippo, MD;  [facility];  Service: Endoscopy;  Laterality: N/A;  . ESOPHAGEAL DILATION  05/08/2012   Procedure: ESOPHAGEAL DILATION;  Surgeon: Malissa Hippo, MD;  [facility];  Service: Endoscopy;  Laterality: N/A;  . ESOPHAGOGASTRODUODENOSCOPY  05/08/2012   Procedure: ESOPHAGOGASTRODUODENOSCOPY (EGD);  Surgeon: Malissa Hippo, MD;  [facility];  Service: Endoscopy;  Laterality: N/A;  . FOREIGN BODY REMOVAL  05/08/2012   Procedure: FOREIGN BODY REMOVAL;  Surgeon: Malissa Hippo, MD;  [facility];  Service: Endoscopy;  Laterality: N/A;    Current Outpatient Medications  Medication Sig Dispense Refill Last Dose  . ibuprofen (ADVIL,MOTRIN) 800 MG tablet Take 800 mg by mouth 3 (three) times daily as needed for mild pain or moderate pain.   0   . pantoprazole (PROTONIX) 40 MG tablet Take 40 mg by mouth daily.   0   . ULORIC 80 MG TABS Take 80 mg by mouth daily.  0   . HYDROcodone-acetaminophen (NORCO) 7.5-325 MG per tablet Take 1 tablet by mouth every 6 (six) hours as needed for moderate pain. (Patient not taking: Reported on 01/24/2015) 20 tablet 0   . naproxen (NAPROSYN) 500 MG tablet Take 1 tablet (500 mg total) by mouth 2 (two) times daily. (Patient not taking: Reported on 06/25/2016) 20 tablet 0    No Known Allergies  Social History   Tobacco Use  . Smoking status: Never Smoker  . Smokeless tobacco: Current User    Types: Snuff  Substance Use Topics  . Alcohol use: Yes    Alcohol/week: 12.0 standard drinks    Types: 12 Cans of beer per week    Family History  Problem Relation Age of Onset  . Healthy Sister   . Gout Brother   . Gout Daughter   . Gout Daughter   . Gout Son      Review of Systems  Constitutional: Positive for activity change. Negative for appetite change, chills, diaphoresis, fatigue, fever and unexpected weight change.  HENT: Negative.   Eyes:  Negative.   Respiratory: Negative.   Cardiovascular: Negative.   Gastrointestinal: Negative.   Endocrine: Negative.   Genitourinary: Negative.   Musculoskeletal: Positive for arthralgias, joint swelling and myalgias. Negative for back pain, gait problem, neck pain and neck stiffness.  Skin: Negative.   Allergic/Immunologic: Negative.   Neurological: Negative.   Hematological: Negative.   Psychiatric/Behavioral: Negative.     Objective:  Physical Exam  Constitutional: He is oriented to person, place, and time. He appears  well-developed. No distress.  Overweight  HENT:  Head: Normocephalic and atraumatic.  Right Ear: External ear normal.  Left Ear: External ear normal.  Nose: Nose normal.  Mouth/Throat: Oropharynx is clear and moist.  Eyes: Conjunctivae and EOM are normal.  Cardiovascular: Normal rate, regular rhythm, normal heart sounds and intact distal pulses.  No murmur heard. Respiratory: Effort normal and breath sounds normal. No respiratory distress. He has no wheezes.  GI: Soft. He exhibits no distension. There is no abdominal tenderness.  Musculoskeletal:     Cervical back: Normal range of motion and neck supple.     Comments: His gait pattern is antalgic, worse on the right  Bilateral Hip Exam: The range of motion: normal with mild discomfort with internal rotation of the bilateral hips. There is no tenderness over the greater trochanter bursa.  Right Knee Exam: No effusion present. No swelling present. The range of motion is 0 to 130 degrees. Marked crepitus on range of motion of the knee. No medial joint line tenderness. No lateral joint line tenderness. The knee is stable.  Left Knee Exam: No effusion present. No swelling present. The range of motion is: 0 to 130 degrees. Marked crepitus on range of motion of the knee. Positive medial greater than lateral tenderness. The knee is stable.  Neurological: He is alert and oriented to person, place, and time. He has normal strength. No sensory deficit.  Skin: No rash noted. He is not diaphoretic. No erythema.  Psychiatric: He has a normal mood and affect. His behavior is normal.    Ht: 6 ft  Wt: 220 lbs  BMI: 29.8  BP: 128/74 sitting L arm  Pulse: 72 bpm regular   Imaging Review Plain radiographs demonstrate severe degenerative joint disease of the right knee(s). The overall alignment ismild varus. The bone quality appears to be good for age and reported activity level.    Assessment/Plan:  End stage primary osteoarthritis,  right knee   The patient history, physical examination, clinical judgment of the provider and imaging studies are consistent with end stage degenerative joint disease of the right knee(s) and total knee arthroplasty is deemed medically necessary. The treatment options including medical management, injection therapy arthroscopy and arthroplasty were discussed at length. The risks and benefits of total knee arthroplasty were presented and reviewed. The risks due to aseptic loosening, infection, stiffness, patella tracking problems, thromboembolic complications and other imponderables were discussed. The patient acknowledged the explanation, agreed to proceed with the plan and consent was signed. Patient is being admitted for inpatient treatment for surgery, pain control, PT, OT, prophylactic antibiotics, VTE prophylaxis, progressive ambulation and ADL's and discharge planning. The patient is planning to be discharged home with outpatient therapy.     Anticipated LOS equal to or greater than 2 midnights due to - Age 66 and older with one or more of the following:  - Obesity  - Expected need for hospital services (PT, OT, Nursing) required for safe  discharge  - Anticipated need for postoperative skilled nursing care  or inpatient rehab  - Active co-morbidities: None OR   - Unanticipated findings during/Post Surgery: None  - Patient is a high risk of re-admission due to: None    Risks and benefits of the surgery were discussed with the patient and Dr.Aluisio at their previous office visit, and the patient has elected to move forward with the aforementioned surgery. Post-operative care plans were discussed with the patient today.    Therapy Plans: outpatient therapy Disposition: Home with wife Planned DVT prophylaxis: aspirin 325mg  BID DME needed: 3-n-1, walker PCP: Dr. Karie Kirks- awaiting clearance Other: no anesthesia concerns Hardware L4-L5  Instructed patient on which meds to stop prior ot  surgery  Ardeen Jourdain, PA-C

## 2020-01-10 ENCOUNTER — Encounter (HOSPITAL_COMMUNITY)
Admission: RE | Admit: 2020-01-10 | Discharge: 2020-01-10 | Disposition: A | Discharge status: Home / Self Care | Payer: Commercial Managed Care - PPO | Source: Ambulatory Visit | Attending: Orthopedic Surgery | Admitting: Orthopedic Surgery

## 2020-01-10 ENCOUNTER — Other Ambulatory Visit: Payer: Self-pay

## 2020-01-10 ENCOUNTER — Encounter (HOSPITAL_COMMUNITY): Payer: Self-pay

## 2020-01-10 DIAGNOSIS — F17290 Nicotine dependence, other tobacco product, uncomplicated: Secondary | ICD-10-CM | POA: Diagnosis not present

## 2020-01-10 DIAGNOSIS — Z01812 Encounter for preprocedural laboratory examination: Secondary | ICD-10-CM | POA: Diagnosis not present

## 2020-01-10 HISTORY — DX: Unspecified osteoarthritis, unspecified site: M19.90

## 2020-01-10 HISTORY — DX: Pneumonia, unspecified organism: J18.9

## 2020-01-10 NOTE — Progress Notes (Signed)
PCP - Dr. Renard Matter. Cardiologist -   Chest x-ray -  EKG -  Stress Test -  ECHO -  Cardiac Cath -   Sleep Study -  CPAP -   Fasting Blood Sugar -  Checks Blood Sugar _____ times a day  Blood Thinner Instructions: Aspirin Instructions: Last Dose:  Anesthesia review: RN recommended to avoid snuff tobacco, at least one day before surgery.Pt. Verbalized he will follow the recommendations.  Patient denies shortness of breath, fever, cough and chest pain at PAT appointment   Patient verbalized understanding of instructions that were given to them at the PAT appointment. Patient was also instructed that they will need to review over the PAT instructions again at home before surgery.

## 2020-01-11 ENCOUNTER — Other Ambulatory Visit (HOSPITAL_COMMUNITY): Payer: 59

## 2020-01-11 ENCOUNTER — Encounter (HOSPITAL_COMMUNITY)
Admission: RE | Admit: 2020-01-11 | Discharge: 2020-01-11 | Disposition: A | Discharge status: Home / Self Care | Payer: Commercial Managed Care - PPO | Source: Ambulatory Visit | Attending: Orthopedic Surgery | Admitting: Orthopedic Surgery

## 2020-01-11 DIAGNOSIS — Z01812 Encounter for preprocedural laboratory examination: Secondary | ICD-10-CM | POA: Diagnosis not present

## 2020-01-11 LAB — COMPREHENSIVE METABOLIC PANEL
ALT: 21 U/L (ref 0–44)
AST: 17 U/L (ref 15–41)
Albumin: 4.4 g/dL (ref 3.5–5.0)
Alkaline Phosphatase: 49 U/L (ref 38–126)
Anion gap: 8 (ref 5–15)
BUN: 20 mg/dL (ref 6–20)
CO2: 25 mmol/L (ref 22–32)
Calcium: 9.4 mg/dL (ref 8.9–10.3)
Chloride: 105 mmol/L (ref 98–111)
Creatinine, Ser: 0.95 mg/dL (ref 0.61–1.24)
GFR calc Af Amer: >60 mL/min (ref >60)
GFR calc non Af Amer: >60 mL/min (ref >60)
Glucose, Bld: 104 mg/dL — ABNORMAL HIGH (ref 70–99)
Potassium: 4.4 mmol/L (ref 3.5–5.1)
Sodium: 138 mmol/L (ref 135–145)
Total Bilirubin: 0.7 mg/dL (ref 0.3–1.2)
Total Protein: 7.4 g/dL (ref 6.5–8.1)

## 2020-01-11 LAB — CBC
HCT: 45.2 % (ref 39.0–52.0)
Hemoglobin: 14.9 g/dL (ref 13.0–17.0)
MCH: 30 pg (ref 26.0–34.0)
MCHC: 33 g/dL (ref 30.0–36.0)
MCV: 91.1 fL (ref 80.0–100.0)
Platelets: 359 10*3/uL (ref 150–400)
RBC: 4.96 MIL/uL (ref 4.22–5.81)
RDW: 13.4 % (ref 11.5–15.5)
WBC: 5.3 10*3/uL (ref 4.0–10.5)
nRBC: 0 % (ref 0.0–0.2)

## 2020-01-11 LAB — ABO/RH: ABO/RH(D): A POS

## 2020-01-11 LAB — PROTIME-INR
INR: 0.8 (ref 0.8–1.2)
Prothrombin Time: 11.4 seconds (ref 11.4–15.2)

## 2020-01-11 LAB — SURGICAL PCR SCREEN
MRSA, PCR: NEGATIVE
Staphylococcus aureus: POSITIVE — AB

## 2020-01-11 LAB — APTT: aPTT: 25 seconds (ref 24–36)

## 2020-01-12 ENCOUNTER — Other Ambulatory Visit (HOSPITAL_COMMUNITY)
Admission: RE | Admit: 2020-01-12 | Discharge: 2020-01-12 | Disposition: A | Discharge status: Home / Self Care | Payer: Commercial Managed Care - PPO | Source: Ambulatory Visit | Attending: Orthopedic Surgery | Admitting: Orthopedic Surgery

## 2020-01-12 DIAGNOSIS — Z20822 Contact with and (suspected) exposure to covid-19: Secondary | ICD-10-CM | POA: Insufficient documentation

## 2020-01-12 DIAGNOSIS — Z01812 Encounter for preprocedural laboratory examination: Secondary | ICD-10-CM | POA: Diagnosis present

## 2020-01-12 LAB — SARS CORONAVIRUS 2 (TAT 6-24 HRS): SARS Coronavirus 2: NEGATIVE

## 2020-01-12 NOTE — Progress Notes (Signed)
PCR results routed to Dr. Aluisio's office for review 

## 2020-01-14 MED ORDER — BUPIVACAINE LIPOSOME 1.3 % IJ SUSP
20.0000 mL | INTRAMUSCULAR | Status: DC
  Filled 2020-01-14: qty 20

## 2020-01-15 ENCOUNTER — Encounter (HOSPITAL_COMMUNITY): Payer: Self-pay | Admitting: Orthopedic Surgery

## 2020-01-15 ENCOUNTER — Ambulatory Visit (HOSPITAL_COMMUNITY): Payer: Commercial Managed Care - PPO | Admitting: Anesthesiology

## 2020-01-15 ENCOUNTER — Encounter (HOSPITAL_COMMUNITY)
Admission: RE | Disposition: A | Discharge status: Home / Self Care | Payer: Self-pay | Source: Ambulatory Visit | Attending: Orthopedic Surgery

## 2020-01-15 ENCOUNTER — Ambulatory Visit (HOSPITAL_COMMUNITY): Payer: Commercial Managed Care - PPO | Admitting: Physician Assistant

## 2020-01-15 ENCOUNTER — Ambulatory Visit (HOSPITAL_COMMUNITY)
Admission: RE | Admit: 2020-01-15 | Discharge: 2020-01-15 | Disposition: A | Discharge status: Home / Self Care | Payer: Commercial Managed Care - PPO | Source: Ambulatory Visit | Attending: Orthopedic Surgery | Admitting: Orthopedic Surgery

## 2020-01-15 DIAGNOSIS — K219 Gastro-esophageal reflux disease without esophagitis: Secondary | ICD-10-CM | POA: Insufficient documentation

## 2020-01-15 DIAGNOSIS — Z79899 Other long term (current) drug therapy: Secondary | ICD-10-CM | POA: Diagnosis not present

## 2020-01-15 DIAGNOSIS — M109 Gout, unspecified: Secondary | ICD-10-CM | POA: Insufficient documentation

## 2020-01-15 DIAGNOSIS — M25861 Other specified joint disorders, right knee: Secondary | ICD-10-CM | POA: Insufficient documentation

## 2020-01-15 DIAGNOSIS — T8482XA Fibrosis due to internal orthopedic prosthetic devices, implants and grafts, initial encounter: Secondary | ICD-10-CM | POA: Diagnosis present

## 2020-01-15 DIAGNOSIS — M25461 Effusion, right knee: Secondary | ICD-10-CM | POA: Insufficient documentation

## 2020-01-15 DIAGNOSIS — M25761 Osteophyte, right knee: Secondary | ICD-10-CM | POA: Insufficient documentation

## 2020-01-15 DIAGNOSIS — M238X1 Other internal derangements of right knee: Secondary | ICD-10-CM | POA: Insufficient documentation

## 2020-01-15 DIAGNOSIS — M1711 Unilateral primary osteoarthritis, right knee: Secondary | ICD-10-CM | POA: Diagnosis not present

## 2020-01-15 DIAGNOSIS — M1710 Unilateral primary osteoarthritis, unspecified knee: Secondary | ICD-10-CM

## 2020-01-15 DIAGNOSIS — M179 Osteoarthritis of knee, unspecified: Secondary | ICD-10-CM

## 2020-01-15 HISTORY — PX: TOTAL KNEE ARTHROPLASTY: SHX125

## 2020-01-15 LAB — TYPE AND SCREEN
ABO/RH(D): A POS
Antibody Screen: NEGATIVE

## 2020-01-15 SURGERY — ARTHROPLASTY, KNEE, TOTAL
Anesthesia: Monitor Anesthesia Care | Site: Knee | Laterality: Right

## 2020-01-15 MED ORDER — METHOCARBAMOL 500 MG IVPB - SIMPLE MED: 500.0000 mg | Freq: Four times a day (QID) | INTRAVENOUS | Status: DC | PRN

## 2020-01-15 MED ORDER — ACETAMINOPHEN 10 MG/ML IV SOLN
1000.0000 mg | Freq: Four times a day (QID) | INTRAVENOUS | Status: DC
  Administered 2020-01-15 (×2): 1000 mg via INTRAVENOUS
  Filled 2020-01-15: qty 100

## 2020-01-15 MED ORDER — ASPIRIN EC 325 MG PO TBEC: 325.0000 mg | DELAYED_RELEASE_TABLET | Freq: Two times a day (BID) | ORAL | 0 refills | Status: AC

## 2020-01-15 MED ORDER — CEFAZOLIN SODIUM-DEXTROSE 2-4 GM/100ML-% IV SOLN
2.0000 g | Freq: Four times a day (QID) | INTRAVENOUS | Status: DC
  Administered 2020-01-15: 13:00:00 2 g via INTRAVENOUS

## 2020-01-15 MED ORDER — MIDAZOLAM HCL 5 MG/5ML IJ SOLN
INTRAMUSCULAR | Status: DC | PRN
  Administered 2020-01-15 (×2): 1 mg via INTRAVENOUS

## 2020-01-15 MED ORDER — POVIDONE-IODINE 10 % EX SWAB
2.0000 "application " | Freq: Once | CUTANEOUS | Status: AC
  Administered 2020-01-15: 2 via TOPICAL

## 2020-01-15 MED ORDER — DEXAMETHASONE SODIUM PHOSPHATE 10 MG/ML IJ SOLN
8.0000 mg | Freq: Once | INTRAMUSCULAR | Status: AC
  Administered 2020-01-15: 08:00:00 8 mg via INTRAVENOUS

## 2020-01-15 MED ORDER — SODIUM CHLORIDE 0.9 % IR SOLN
Status: DC | PRN
  Administered 2020-01-15: 1000 mL

## 2020-01-15 MED ORDER — FENTANYL CITRATE (PF) 100 MCG/2ML IJ SOLN: 25.0000 ug | INTRAMUSCULAR | Status: DC | PRN

## 2020-01-15 MED ORDER — MIDAZOLAM HCL 2 MG/2ML IJ SOLN
INTRAMUSCULAR | Status: AC
  Filled 2020-01-15: qty 2

## 2020-01-15 MED ORDER — ACETAMINOPHEN 10 MG/ML IV SOLN
INTRAVENOUS | Status: AC
  Filled 2020-01-15: qty 100

## 2020-01-15 MED ORDER — CHLORHEXIDINE GLUCONATE 4 % EX LIQD: 60.0000 mL | Freq: Once | CUTANEOUS | Status: DC

## 2020-01-15 MED ORDER — LACTATED RINGERS IV BOLUS
250.0000 mL | Freq: Once | INTRAVENOUS | Status: AC
  Administered 2020-01-15: 12:00:00 250 mL via INTRAVENOUS

## 2020-01-15 MED ORDER — LACTATED RINGERS IV BOLUS
250.0000 mL | Freq: Once | INTRAVENOUS | Status: AC
  Administered 2020-01-15: 13:00:00 250 mL via INTRAVENOUS

## 2020-01-15 MED ORDER — OXYCODONE HCL 5 MG PO TABS
5.0000 mg | ORAL_TABLET | Freq: Once | ORAL | Status: AC | PRN
  Administered 2020-01-15: 5 mg via ORAL

## 2020-01-15 MED ORDER — OXYCODONE HCL 5 MG PO TABS: 5.0000 mg | ORAL_TABLET | ORAL | Status: DC | PRN

## 2020-01-15 MED ORDER — OXYCODONE HCL 5 MG PO TABS: 5.0000 mg | ORAL_TABLET | Freq: Four times a day (QID) | ORAL | 0 refills | Status: DC | PRN

## 2020-01-15 MED ORDER — LACTATED RINGERS IV SOLN: INTRAVENOUS | Status: DC

## 2020-01-15 MED ORDER — ACETAMINOPHEN 160 MG/5ML PO SOLN: 1000.0000 mg | Freq: Once | ORAL | Status: DC | PRN

## 2020-01-15 MED ORDER — SODIUM CHLORIDE (PF) 0.9 % IJ SOLN
INTRAMUSCULAR | Status: AC
  Filled 2020-01-15: qty 50

## 2020-01-15 MED ORDER — ACETAMINOPHEN 10 MG/ML IV SOLN: 1000.0000 mg | Freq: Once | INTRAVENOUS | Status: DC | PRN

## 2020-01-15 MED ORDER — ROPIVACAINE HCL 7.5 MG/ML IJ SOLN
INTRAMUSCULAR | Status: DC | PRN
  Administered 2020-01-15: 20 mL via PERINEURAL

## 2020-01-15 MED ORDER — OXYCODONE HCL 5 MG/5ML PO SOLN: 5.0000 mg | Freq: Once | ORAL | Status: AC | PRN

## 2020-01-15 MED ORDER — PROPOFOL 500 MG/50ML IV EMUL
INTRAVENOUS | Status: DC | PRN
  Administered 2020-01-15: 75 ug/kg/min via INTRAVENOUS

## 2020-01-15 MED ORDER — GABAPENTIN 300 MG PO CAPS: ORAL_CAPSULE | ORAL | 0 refills | Status: AC

## 2020-01-15 MED ORDER — FENTANYL CITRATE (PF) 100 MCG/2ML IJ SOLN
INTRAMUSCULAR | Status: AC
  Filled 2020-01-15: qty 2

## 2020-01-15 MED ORDER — MEPIVACAINE HCL (PF) 2 % IJ SOLN
INTRAMUSCULAR | Status: DC | PRN
  Administered 2020-01-15: 50 mg via INTRATHECAL

## 2020-01-15 MED ORDER — PROPOFOL 500 MG/50ML IV EMUL
INTRAVENOUS | Status: AC
  Filled 2020-01-15: qty 50

## 2020-01-15 MED ORDER — SODIUM CHLORIDE (PF) 0.9 % IJ SOLN
INTRAMUSCULAR | Status: DC | PRN
  Administered 2020-01-15: 60 mL

## 2020-01-15 MED ORDER — ONDANSETRON HCL 4 MG/2ML IJ SOLN
INTRAMUSCULAR | Status: AC
  Filled 2020-01-15: qty 2

## 2020-01-15 MED ORDER — CEFAZOLIN SODIUM-DEXTROSE 2-4 GM/100ML-% IV SOLN
2.0000 g | INTRAVENOUS | Status: AC
  Administered 2020-01-15: 2 g via INTRAVENOUS
  Filled 2020-01-15: qty 100

## 2020-01-15 MED ORDER — MEPIVACAINE HCL (PF) 2 % IJ SOLN
INTRAMUSCULAR | Status: AC
  Filled 2020-01-15: qty 40

## 2020-01-15 MED ORDER — FENTANYL CITRATE (PF) 100 MCG/2ML IJ SOLN
INTRAMUSCULAR | Status: DC | PRN
  Administered 2020-01-15 (×2): 50 ug via INTRAVENOUS

## 2020-01-15 MED ORDER — TRAMADOL HCL 50 MG PO TABS
ORAL_TABLET | ORAL | Status: AC
  Filled 2020-01-15: qty 1

## 2020-01-15 MED ORDER — TRAMADOL HCL 50 MG PO TABS: 50.0000 mg | ORAL_TABLET | Freq: Four times a day (QID) | ORAL | 0 refills | Status: AC | PRN

## 2020-01-15 MED ORDER — SODIUM CHLORIDE (PF) 0.9 % IJ SOLN
INTRAMUSCULAR | Status: AC
  Filled 2020-01-15: qty 10

## 2020-01-15 MED ORDER — PROPOFOL 10 MG/ML IV BOLUS
INTRAVENOUS | Status: AC
  Filled 2020-01-15: qty 40

## 2020-01-15 MED ORDER — METHOCARBAMOL 500 MG PO TABS
ORAL_TABLET | ORAL | Status: AC
  Filled 2020-01-15: qty 1

## 2020-01-15 MED ORDER — ONDANSETRON HCL 4 MG/2ML IJ SOLN
INTRAMUSCULAR | Status: DC | PRN
  Administered 2020-01-15: 4 mg via INTRAVENOUS

## 2020-01-15 MED ORDER — ACETAMINOPHEN 500 MG PO TABS: 1000.0000 mg | ORAL_TABLET | Freq: Once | ORAL | Status: DC | PRN

## 2020-01-15 MED ORDER — BUPIVACAINE LIPOSOME 1.3 % IJ SUSP
INTRAMUSCULAR | Status: DC | PRN
  Administered 2020-01-15: 20 mL

## 2020-01-15 MED ORDER — DEXAMETHASONE SODIUM PHOSPHATE 10 MG/ML IJ SOLN
INTRAMUSCULAR | Status: AC
  Filled 2020-01-15: qty 1

## 2020-01-15 MED ORDER — TRAMADOL HCL 50 MG PO TABS
50.0000 mg | ORAL_TABLET | Freq: Four times a day (QID) | ORAL | Status: DC | PRN
  Administered 2020-01-15: 50 mg via ORAL

## 2020-01-15 MED ORDER — LACTATED RINGERS IV BOLUS
500.0000 mL | Freq: Once | INTRAVENOUS | Status: AC
  Administered 2020-01-15: 09:00:00 500 mL via INTRAVENOUS

## 2020-01-15 MED ORDER — METHOCARBAMOL 500 MG PO TABS
500.0000 mg | ORAL_TABLET | Freq: Four times a day (QID) | ORAL | Status: DC | PRN
  Administered 2020-01-15: 12:00:00 500 mg via ORAL

## 2020-01-15 MED ORDER — PROPOFOL 10 MG/ML IV BOLUS
INTRAVENOUS | Status: DC | PRN
  Administered 2020-01-15 (×2): 30 mg via INTRAVENOUS

## 2020-01-15 MED ORDER — CEFAZOLIN SODIUM-DEXTROSE 2-4 GM/100ML-% IV SOLN
INTRAVENOUS | Status: AC
  Filled 2020-01-15: qty 100

## 2020-01-15 MED ORDER — METHOCARBAMOL 500 MG PO TABS: 500.0000 mg | ORAL_TABLET | Freq: Four times a day (QID) | ORAL | 0 refills | Status: DC | PRN

## 2020-01-15 MED ORDER — OXYCODONE HCL 5 MG PO TABS
ORAL_TABLET | ORAL | Status: AC
  Filled 2020-01-15: qty 1

## 2020-01-15 MED ORDER — 0.9 % SODIUM CHLORIDE (POUR BTL) OPTIME
TOPICAL | Status: DC | PRN
  Administered 2020-01-15: 1000 mL

## 2020-01-15 MED ORDER — TRANEXAMIC ACID-NACL 1000-0.7 MG/100ML-% IV SOLN
1000.0000 mg | INTRAVENOUS | Status: AC
  Administered 2020-01-15: 1000 mg via INTRAVENOUS
  Filled 2020-01-15: qty 100

## 2020-01-15 SURGICAL SUPPLY — 63 items
ATTUNE MED DOME PAT 41 KNEE (Knees) ×1 IMPLANT
ATTUNE PS FEM RT SZ 7 CEM KNEE (Femur) ×1 IMPLANT
ATTUNE PSRP INSR SZ7 7 KNEE (Insert) ×1 IMPLANT
BAG SPEC THK2 15X12 ZIP CLS (MISCELLANEOUS) ×1
BAG ZIPLOCK 12X15 (MISCELLANEOUS) ×2 IMPLANT
BASE TIBIAL ROT PLAT SZ 7 KNEE (Knees) IMPLANT
BLADE SAG 18X100X1.27 (BLADE) ×2 IMPLANT
BLADE SAW SGTL 11.0X1.19X90.0M (BLADE) ×2 IMPLANT
BLADE SURG SZ10 CARB STEEL (BLADE) ×4 IMPLANT
BNDG ELASTIC 6X5.8 VLCR STR LF (GAUZE/BANDAGES/DRESSINGS) ×2 IMPLANT
BOWL SMART MIX CTS (DISPOSABLE) ×2 IMPLANT
BSPLAT TIB 7 CMNT ROT PLAT STR (Knees) ×1 IMPLANT
CEMENT HV SMART SET (Cement) ×4 IMPLANT
COVER SURGICAL LIGHT HANDLE (MISCELLANEOUS) ×2 IMPLANT
COVER WAND RF STERILE (DRAPES) IMPLANT
CUFF TOURN SGL QUICK 34 (TOURNIQUET CUFF) ×2
CUFF TRNQT CYL 34X4.125X (TOURNIQUET CUFF) ×1 IMPLANT
DECANTER SPIKE VIAL GLASS SM (MISCELLANEOUS) ×2 IMPLANT
DRAPE U-SHAPE 47X51 STRL (DRAPES) ×2 IMPLANT
DRSG ADAPTIC 3X8 NADH LF (GAUZE/BANDAGES/DRESSINGS) ×2 IMPLANT
DRSG AQUACEL AG ADV 3.5X 6 (GAUZE/BANDAGES/DRESSINGS) IMPLANT
DRSG AQUACEL AG ADV 3.5X10 (GAUZE/BANDAGES/DRESSINGS) ×1 IMPLANT
DRSG PAD ABDOMINAL 8X10 ST (GAUZE/BANDAGES/DRESSINGS) ×2 IMPLANT
DURAPREP 26ML APPLICATOR (WOUND CARE) ×2 IMPLANT
ELECT REM PT RETURN 15FT ADLT (MISCELLANEOUS) ×2 IMPLANT
EVACUATOR 1/8 PVC DRAIN (DRAIN) ×2 IMPLANT
GAUZE SPONGE 2X2 8PLY STRL LF (GAUZE/BANDAGES/DRESSINGS) IMPLANT
GAUZE SPONGE 4X4 12PLY STRL (GAUZE/BANDAGES/DRESSINGS) ×2 IMPLANT
GLOVE BIO SURGEON STRL SZ7 (GLOVE) ×2 IMPLANT
GLOVE BIO SURGEON STRL SZ8 (GLOVE) ×2 IMPLANT
GLOVE BIOGEL PI IND STRL 6.5 (GLOVE) ×1 IMPLANT
GLOVE BIOGEL PI IND STRL 7.0 (GLOVE) ×1 IMPLANT
GLOVE BIOGEL PI IND STRL 8 (GLOVE) ×1 IMPLANT
GLOVE BIOGEL PI INDICATOR 6.5 (GLOVE) ×1
GLOVE BIOGEL PI INDICATOR 7.0 (GLOVE) ×1
GLOVE BIOGEL PI INDICATOR 8 (GLOVE) ×1
GLOVE SURG SS PI 6.5 STRL IVOR (GLOVE) ×2 IMPLANT
GOWN STRL REUS W/TWL LRG LVL3 (GOWN DISPOSABLE) ×6 IMPLANT
HANDPIECE INTERPULSE COAX TIP (DISPOSABLE) ×2
HOLDER FOLEY CATH W/STRAP (MISCELLANEOUS) IMPLANT
IMMOBILIZER KNEE 20 (SOFTGOODS) ×2
IMMOBILIZER KNEE 20 THIGH 36 (SOFTGOODS) ×1 IMPLANT
KIT TURNOVER KIT A (KITS) IMPLANT
MANIFOLD NEPTUNE II (INSTRUMENTS) ×2 IMPLANT
NS IRRIG 1000ML POUR BTL (IV SOLUTION) ×2 IMPLANT
PACK TOTAL KNEE CUSTOM (KITS) ×2 IMPLANT
PADDING CAST COTTON 6X4 STRL (CAST SUPPLIES) ×4 IMPLANT
PENCIL SMOKE EVACUATOR (MISCELLANEOUS) IMPLANT
PIN STEINMAN FIXATION KNEE (PIN) ×1 IMPLANT
PROTECTOR NERVE ULNAR (MISCELLANEOUS) ×2 IMPLANT
SET HNDPC FAN SPRY TIP SCT (DISPOSABLE) ×1 IMPLANT
SPONGE GAUZE 2X2 STER 10/PKG (GAUZE/BANDAGES/DRESSINGS)
STRIP CLOSURE SKIN 1/2X4 (GAUZE/BANDAGES/DRESSINGS) ×4 IMPLANT
SUT MNCRL AB 4-0 PS2 18 (SUTURE) ×2 IMPLANT
SUT STRATAFIX 0 PDS 27 VIOLET (SUTURE) ×2
SUT VIC AB 2-0 CT1 27 (SUTURE) ×6
SUT VIC AB 2-0 CT1 TAPERPNT 27 (SUTURE) ×3 IMPLANT
SUTURE STRATFX 0 PDS 27 VIOLET (SUTURE) ×1 IMPLANT
TIBIAL BASE ROT PLAT SZ 7 KNEE (Knees) ×2 IMPLANT
TRAY FOLEY MTR SLVR 16FR STAT (SET/KITS/TRAYS/PACK) ×2 IMPLANT
WATER STERILE IRR 1000ML POUR (IV SOLUTION) ×4 IMPLANT
WRAP KNEE MAXI GEL POST OP (GAUZE/BANDAGES/DRESSINGS) ×2 IMPLANT
YANKAUER SUCT BULB TIP 10FT TU (MISCELLANEOUS) ×2 IMPLANT

## 2020-01-15 NOTE — Progress Notes (Signed)
Physical Therapy Evaluation Patient Details Name: Erik Reid MRN: [id number] DOB: January 01, 1971 Today's Date: 01/15/2020   History of Present Illness  s/p R TKA  Clinical Impression  Patient evaluated by Physical Therapy with no further acute PT needs identified. All education has been completed and the patient has no further questions. See below for any follow-up Physical Therapy or equipment needs. PT is signing off. Thank you for this referral.     Follow Up Recommendations Follow surgeon's recommendation for DC plan and follow-up therapies    Equipment Recommendations       Recommendations for Other Services       Precautions / Restrictions Precautions Precautions: Knee;Fall Required Braces or Orthoses: Knee Immobilizer - Right Knee Immobilizer - Right: Discontinue once straight leg raise with < 10 degree lag Restrictions Weight Bearing Restrictions: No      Mobility  Bed Mobility Overal bed mobility: Needs Assistance Bed Mobility: Supine to Sit;Sit to Supine     Supine to sit: Min guard Sit to supine: Min guard   General bed mobility comments: cues for technique  Transfers Overall transfer level: Needs assistance Equipment used: Rolling walker (2 wheeled) Transfers: Sit to/from Stand Sit to Stand: Min guard         General transfer comment: cues for hand placement and RLE position  Ambulation/Gait Ambulation/Gait assistance: Min assist Gait Distance (Feet): 200 Feet Assistive device: Rolling walker (2 wheeled) Gait Pattern/deviations: Step-to pattern;Decreased stance time - right     General Gait Details: cues for sequence and RW position  Stairs Stairs: Yes Stairs assistance: Min assist;Min guard Stair Management: Sideways Number of Stairs: 2 General stair comments: cues for sequence and  safe technique  Wheelchair Mobility    Modified Rankin (Stroke Patients Only)       Balance                                             Pertinent Vitals/Pain Pain Assessment: 0-10 Pain Score: 2  Pain Location: right knee Pain Descriptors / Indicators: Aching;Burning;Grimacing Pain Intervention(s): Repositioned;Premedicated before session;Monitored during session;Limited activity within patient's tolerance    Home Living Family/patient expects to be discharged to:: Private residence Living Arrangements: Spouse/significant other Available Help at Discharge: Family Type of Home: House Home Access: Stairs to enter Entrance Stairs-Rails: Right Entrance Stairs-Number of Steps: 2 Home Layout: One level Home Equipment: None      Prior Function Level of Independence: Independent               Hand Dominance        Extremity/Trunk Assessment   Upper Extremity Assessment Upper Extremity Assessment: Overall WFL for tasks assessed    Lower Extremity Assessment Lower Extremity Assessment: RLE deficits/detail RLE Deficits / Details: ankle WFL,   knee extension and hip flexion 2+/5       Communication   Communication: No difficulties  Cognition Arousal/Alertness: Awake/alert Behavior During Therapy: WFL for tasks assessed/performed Overall Cognitive Status: Within Functional Limits for tasks assessed                                        General Comments      Exercises  ankle pumps x10 bil  SLR x10 RLE  Heel slides x10 RLE  AAROM Hip abd/add x  10 RLE AROM Quad sets x 10 bil    Assessment/Plan    PT Assessment All further PT needs can be met in the next venue of care  PT Problem List         PT Treatment Interventions      PT Goals (Current goals can be found in the Care Plan section)  Acute Rehab PT Goals PT Goal Formulation: All assessment and education complete, DC therapy    Frequency     Barriers to discharge        Co-evaluation               AM-PAC PT "6 Clicks" Mobility  Outcome Measure Help needed turning from your back to your side while in  a flat bed without using bedrails?: None Help needed moving from lying on your back to sitting on the side of a flat bed without using bedrails?: A Little Help needed moving to and from a bed to a chair (including a wheelchair)?: A Little Help needed standing up from a chair using your arms (e.g., wheelchair or bedside chair)?: A Little Help needed to walk in hospital room?: A Little Help needed climbing 3-5 steps with a railing? : A Little 6 Click Score: 19    End of Session Equipment Utilized During Treatment: Gait belt Activity Tolerance: Patient tolerated treatment well Patient left: with call bell/phone within reach;in bed   PT Visit Diagnosis: Difficulty in walking, not elsewhere classified (R26.2)    Time: 2197-5883 PT Time Calculation (min) (ACUTE ONLY): 27 min   Charges:   PT Evaluation $PT Eval Low Complexity: 1 Low PT Treatments $Gait Training: 8-22 mins        Baxter Flattery, PT   Acute Rehab Dept [facility]): [phone]   01/15/2020   [facility] 01/15/2020, 1:55 PM

## 2020-01-15 NOTE — Transfer of Care (Signed)
Immediate Anesthesia Transfer of Care Note  Patient: Erik Reid  Procedure(s) Performed: TOTAL KNEE ARTHROPLASTY (Right Knee)  Patient Location: PACU  Anesthesia Type:Spinal and MAC combined with regional for post-op pain  Level of Consciousness: awake, alert , oriented and patient cooperative  Airway & Oxygen Therapy: Patient Spontanous Breathing and Patient connected to face mask oxygen  Post-op Assessment: Report given to RN and Post -op Vital signs reviewed and stable  Post vital signs: Reviewed and stable  Last Vitals:  Vitals Value Taken Time  BP 100/76 01/15/20 0845  Temp    Pulse 54 01/15/20 0846  Resp 15 01/15/20 0846  SpO2 100 % 01/15/20 0846  Vitals shown include unvalidated device data.  Last Pain:  Vitals:   01/15/20 0551  TempSrc: Oral  PainSc: 0-No pain         Complications: No apparent anesthesia complications

## 2020-01-15 NOTE — Anesthesia Procedure Notes (Signed)
Anesthesia Regional Block: Adductor canal block   Pre-Anesthetic Checklist: ,, timeout performed, Correct Patient, Correct Site, Correct Laterality, Correct Procedure, Correct Position, site marked, Risks and benefits discussed,  Surgical consent,  Pre-op evaluation,  At surgeon's request and post-op pain management  Laterality: Right and Lower  Prep: chloraprep       Needles:  Injection technique: Single-shot     Needle Length: 9cm  Needle Gauge: 22     Additional Needles: Arrow StimuQuik ECHO Echogenic Stimulating PNB Needle  Procedures:,,,, ultrasound used (permanent image in chart),,,,  Narrative:  Start time: 01/15/2020 6:58 AM End time: 01/15/2020 7:05 AM Injection made incrementally with aspirations every 5 mL.  Performed by: Personally  Anesthesiologist: Val Eagle, MD

## 2020-01-15 NOTE — OR Nursing (Signed)
Patient straight cath at the end of the case. of clear yellow urine out.

## 2020-01-15 NOTE — Discharge Instructions (Addendum)
Ollen Gross, MD Total Joint Specialist [facility] [address] [place], [place] [address] [phone]    TOTAL KNEE REPLACEMENT POSTOPERATIVE DIRECTIONS    Knee Rehabilitation, Guidelines Following Surgery  Results after knee surgery are often greatly improved when you follow the exercise, range of motion and muscle strengthening exercises prescribed by your doctor. Safety measures are also important to protect the knee from further injury. If any of these exercises cause you to have increased pain or swelling in your knee joint, decrease the amount until you are comfortable again and slowly increase them. If you have problems or questions, call your caregiver or physical therapist for advice.   BLOOD CLOT PREVENTION . Take a 325 mg Aspirin two times a day for three weeks following surgery. Then take an 81 mg Aspirin once a day for three weeks. Then discontinue Aspirin. Erik Reid may resume your vitamins/supplements upon discharge from the hospital. . Do not take any NSAIDs (Advil, Aleve, Ibuprofen, Meloxicam, etc.) until you have discontinued the 325 mg Aspirin.  HOME CARE INSTRUCTIONS  . Remove items at home which could result in a fall. This includes throw rugs or furniture in walking pathways.   ICE to the affected knee as much as tolerated. Icing helps control swelling. If the swelling is well controlled you will be more comfortable and rehab easier. Continue to use ice on the knee for pain and swelling from surgery. You may notice swelling that will progress down to the foot and ankle. This is normal after surgery. Elevate the leg when you are not up walking on it.    Continue to use the breathing machine which will help keep your temperature down.  It is common for your temperature to cycle up and down following surgery, especially at night when you are not up moving around and exerting yourself.  The breathing machine keeps your lungs expanded and  your temperature down.  Do not place pillow under knee, focus on keeping the knee straight while resting  PAIN CONTROL Achieving adequate pain control can be challenging in the first 48-72 hours after the nerve block wears off. During this time it is best to stay ahead of the pain by taking your prescribed pain meds every 4-6 hours. Once the pain is well controlled (you will not be pain free but the goal is having it under control) you may begin slowly weaning off the medications  DIET You may resume your previous home diet once you are discharged from the hospital.  DRESSING / WOUND CARE / SHOWERING . Keep your bulky bandage on for 2 days. On the third post-operative day you may remove the Ace bandage, cotton and gauze,. There is a waterproof adhesive bandage on your skin which will stay on for 7-10 days. Once you remove this you will not need to place another bandage . You may begin showering 3 days after surgery. . Do not submerge the incision under water.  ACTIVITY For the first 5 days the key is rest and control of pain and swelling . You should rest, ice and elevate the leg for 50 minutes out of every hour. Get up and walk/stretch for 10 minutes per hour. After 5 days you can increase your activity slowly as tolerated . Walk with your walker as instructed. Use the walker until you are comfortable transitioning to a cane. Walk with the cane in the opposite hand of the operative leg. You may discontinue the cane once you are  comfortable. . You may discontinue the knee immobilizer once you are able to perform a straight leg raise while lying down . Avoid periods of inactivity such as sitting longer than an hour when not asleep. This helps prevent blood clots.  . Do your home exercises twice a day starting on post-operative day 3. On the days you go to physical therapy, just do the home exercises once that day. . Do not drive a car until released by your surgeon.  . Do not drive while taking  narcotics.  TED HOSE STOCKINGS Wear the elastic stockings on both legs for three weeks following surgery during the day. You may remove them at night for sleeping.  WEIGHT BEARING You may bear weight as tolerated on the operative leg.  POSTOPERATIVE CONSTIPATION PROTOCOL Constipation - defined medically as fewer than three stools per week and severe constipation as less than one stool per week.  One of the most common issues patients have following surgery is constipation.  Even if you have a regular bowel pattern at home, your normal regimen is likely to be disrupted due to multiple reasons following surgery.  Combination of anesthesia, postoperative narcotics, change in appetite and fluid intake all can affect your bowels.  In order to avoid complications following surgery, here are some recommendations in order to help you during your recovery period.  Colace (docusate) - Pick up an over-the-counter form of Colace or another stool softener and take twice a day as long as you are requiring postoperative pain medications.  Take with a full glass of water daily.  If you experience loose stools or diarrhea, hold the colace until you stool forms back up.  If your symptoms do not get better within 1 week or if they get worse, check with your doctor.  MiraLax (polyethylene glycol) - Pick up over-the-counter to have on hand.  MiraLax is a solution that will increase the amount of water in your bowels to assist with bowel movements.  Take as directed and can mix with a glass of water, juice, soda, coffee, or tea.  Take if you go more than two days without a movement. Do not use MiraLax more than once per day. Call your doctor if you are still constipated or irregular after using this medication for 7 days in a row.  If you continue to have problems with postoperative constipation, please contact the office for further assistance and recommendations.  If you experience "the worst abdominal pain ever" or  develop nausea or vomiting, please contact the office immediatly for further recommendations for treatment.  ITCHING  If you experience itching with your medications, try taking only a single pain pill, or even half a pain pill at a time.  You can also use Benadryl over the counter for itching or also to help with sleep.   MEDICATIONS See your medication summary on the "After Visit Summary" that the nursing staff will review with you prior to discharge.  You may have some home medications which will be placed on hold until you complete the course of blood thinner medication.  It is important for you to complete the blood thinner medication as prescribed by your surgeon.  Continue your approved medications as instructed at time of discharge.  PRECAUTIONS If you experience chest pain or shortness of breath - call 911 immediately for transfer to the hospital emergency department.  If you develop a fever greater that 101 F, purulent drainage from wound, increased redness or drainage from wound, foul  odor from the wound/dressing, or calf pain - CONTACT YOUR SURGEON.                                                   FOLLOW-UP APPOINTMENTS Make sure you keep all of your appointments after your operation with your surgeon and caregivers. You should call the office at the above phone number and make an appointment for approximately two weeks after the date of your surgery or on the date instructed by your surgeon outlined in the "After Visit Summary".  MAKE SURE YOU:  . Understand these instructions.  . Get help right away if you are not doing well or get worse.    Pick up stool softner and laxative for home use following surgery while on pain medications. May shower starting three days after surgery. Please use a clean towel to pat the incision dry following showers. Continue to use ice for pain and swelling after surgery. Do not use any lotions or creams on the incision until instructed by your  surgeon.   General Anesthesia, Adult, Care After This sheet gives you information about how to care for yourself after your procedure. Your health care provider may also give you more specific instructions. If you have problems or questions, contact your health care provider. What can I expect after the procedure? After the procedure, the following side effects are common:  Pain or discomfort at the IV site.  Nausea.  Vomiting.  Sore throat.  Trouble concentrating.  Feeling cold or chills.  Weak or tired.  Sleepiness and fatigue.  Soreness and body aches. These side effects can affect parts of the body that were not involved in surgery. Follow these instructions at home:  For at least 24 hours after the procedure:  Have a responsible adult stay with you. It is important to have someone help care for you until you are awake and alert.  Rest as needed.  Do not: ? Participate in activities in which you could fall or become injured. ? Drive. ? Use heavy machinery. ? Drink alcohol. ? Take sleeping pills or medicines that cause drowsiness. ? Make important decisions or sign legal documents. ? Take care of children on your own. Eating and drinking  Follow any instructions from your health care provider about eating or drinking restrictions.  When you feel hungry, start by eating small amounts of foods that are soft and easy to digest (bland), such as toast. Gradually return to your regular diet.  Drink enough fluid to keep your urine pale yellow.  If you vomit, rehydrate by drinking water, juice, or clear broth. General instructions  If you have sleep apnea, surgery and certain medicines can increase your risk for breathing problems. Follow instructions from your health care provider about wearing your sleep device: ? Anytime you are sleeping, including during daytime naps. ? While taking prescription pain medicines, sleeping medicines, or medicines that make you  drowsy.  Return to your normal activities as told by your health care provider. Ask your health care provider what activities are safe for you.  Take over-the-counter and prescription medicines only as told by your health care provider.  If you smoke, do not smoke without supervision.  Keep all follow-up visits as told by your health care provider. This is important. Contact a health care provider if:  You have nausea  or vomiting that does not get better with medicine.  You cannot eat or drink without vomiting.  You have pain that does not get better with medicine.  You are unable to pass urine.  You develop a skin rash.  You have a fever.  You have redness around your IV site that gets worse. Get help right away if:  You have difficulty breathing.  You have chest pain.  You have blood in your urine or stool, or you vomit blood. Summary  After the procedure, it is common to have a sore throat or nausea. It is also common to feel tired.  Have a responsible adult stay with you for the first 24 hours after general anesthesia. It is important to have someone help care for you until you are awake and alert.  When you feel hungry, start by eating small amounts of foods that are soft and easy to digest (bland), such as toast. Gradually return to your regular diet.  Drink enough fluid to keep your urine pale yellow.  Return to your normal activities as told by your health care provider. Ask your health care provider what activities are safe for you. This information is not intended to replace advice given to you by your health care provider. Make sure you discuss any questions you have with your health care provider. Document Revised: 11/12/2017 Document Reviewed: 06/25/2017 Elsevier Patient Education  2020 [employer].

## 2020-01-15 NOTE — Anesthesia Procedure Notes (Signed)
Spinal  Patient location during procedure: OR Start time: 01/15/2020 7:18 AM End time: 01/15/2020 7:26 AM Reason for block: at surgeon's request Staffing Performed: resident/CRNA  Resident/CRNA: West Pugh, CRNA Preanesthetic Checklist Completed: patient identified, IV checked, site marked, risks and benefits discussed, surgical consent, monitors and equipment checked, pre-op evaluation and timeout performed Spinal Block Patient position: sitting Prep: DuraPrep Patient monitoring: heart rate, continuous pulse ox and blood pressure Approach: midline Location: L3-4 Injection technique: single-shot Needle Needle type: Pencan  Needle gauge: 24 G Needle length: 9 cm Assessment Sensory level: T6 Additional Notes Expiration of kit checked and confirmed Lot #[id number] Exp 03/22/2021. Patient tolerated procedure well,without complications with noted clear CSF. Loss of motor and sensory on exam post injection. Dr Ermalene Postin present for procedure.

## 2020-01-15 NOTE — Anesthesia Preprocedure Evaluation (Addendum)
Anesthesia Evaluation  Patient identified by MRN, date of birth, ID band Patient awake    Reviewed: Allergy & Precautions, NPO status , Patient's Chart, lab work & pertinent test results  History of Anesthesia Complications Negative for: history of anesthetic complications  Airway Mallampati: II  TM Distance: >3 FB Neck ROM: Full    Dental  (+) Dental Advisory Given, Teeth Intact   Pulmonary neg shortness of breath, asthma , neg recent URI,    breath sounds clear to auscultation       Cardiovascular negative cardio ROS   Rhythm:Regular     Neuro/Psych negative neurological ROS  negative psych ROS   GI/Hepatic Neg liver ROS, GERD  Medicated and Controlled,  Endo/Other  negative endocrine ROS  Renal/GU negative Renal ROS     Musculoskeletal negative musculoskeletal ROS (+) Arthritis ,   Abdominal   Peds  Hematology negative hematology ROS (+) PTT 25 INR 0.8 Plts 359 NO anticoags   Anesthesia Other Findings   Reproductive/Obstetrics                           Anesthesia Physical Anesthesia Plan  ASA: II  Anesthesia Plan: MAC, Regional and Spinal   Post-op Pain Management:  Regional for Post-op pain   Induction:   PONV Risk Score and Plan: 1 and Treatment may vary due to age or medical condition and Propofol infusion  Airway Management Planned: Nasal Cannula  Additional Equipment: None  Intra-op Plan:   Post-operative Plan: Extubation in OR  Informed Consent: I have reviewed the patients History and Physical, chart, labs and discussed the procedure including the risks, benefits and alternatives for the proposed anesthesia with the patient or authorized representative who has indicated his/her understanding and acceptance.     Dental advisory given  Plan Discussed with: CRNA and Surgeon  Anesthesia Plan Comments:         Anesthesia Quick Evaluation

## 2020-01-15 NOTE — Op Note (Signed)
OPERATIVE REPORT-TOTAL KNEE ARTHROPLASTY   Pre-operative diagnosis- Osteoarthritis  Right knee(s)  Post-operative diagnosis- Osteoarthritis Right knee(s)  Procedure-  Right  Total Knee Arthroplasty  Surgeon- Gus Rankin. Kathan Kirker, MD  Assistant- Dimitri Ped, PA-C   Anesthesia-  Adductor canal block and spinal  EBL-25 mL   Drains Hemovac  Tourniquet time-  Total Tourniquet Time Documented: Thigh (Right) - 35 minutes Total: Thigh (Right) - 35 minutes     Complications- None  Condition-PACU - hemodynamically stable.   Brief Clinical Note  EDER MACEK is a 49 y.o. year old male with end stage OA of his right knee with progressively worsening pain and dysfunction. He has constant pain, with activity and at rest and significant functional deficits with difficulties even with ADLs. He has had extensive non-op management including analgesics, injections of cortisone and viscosupplements, and home exercise program, but remains in significant pain with significant dysfunction. Radiographs show bone on bone arthritis medial and patellofemoral. He presents now for right Total Knee Arthroplasty.    Procedure in detail---   The patient is brought into the operating room and positioned supine on the operating table. After successful administration of  Adductor canal block and spinal,   a tourniquet is placed high on the  Right thigh(s) and the lower extremity is prepped and draped in the usual sterile fashion. Time out is performed by the operating team and then the  Right lower extremity is wrapped in Esmarch, knee flexed and the tourniquet inflated to 300 mmHg.       A midline incision is made with a ten blade through the subcutaneous tissue to the level of the extensor mechanism. A fresh blade is used to make a medial parapatellar arthrotomy. Soft tissue over the proximal medial tibia is subperiosteally elevated to the joint line with a knife and into the semimembranosus bursa with a Cobb  elevator. Soft tissue over the proximal lateral tibia is elevated with attention being paid to avoiding the patellar tendon on the tibial tubercle. The patella is everted, knee flexed 90 degrees and the ACL and PCL are removed. Findings are bone on bone medial and patellofemoral with large global osteophytes.        The drill is used to create a starting hole in the distal femur and the canal is thoroughly irrigated with sterile saline to remove the fatty contents. The 5 degree Right  valgus alignment guide is placed into the femoral canal and the distal femoral cutting block is pinned to remove 9 mm off the distal femur. Resection is made with an oscillating saw.      The tibia is subluxed forward and the menisci are removed. The extramedullary alignment guide is placed referencing proximally at the medial aspect of the tibial tubercle and distally along the second metatarsal axis and tibial crest. The block is pinned to remove 36mm off the more deficient medial  side. Resection is made with an oscillating saw. Size 7is the most appropriate size for the tibia and the proximal tibia is prepared with the modular drill and keel punch for that size.      The femoral sizing guide is placed and size 7 is most appropriate. Rotation is marked off the epicondylar axis and confirmed by creating a rectangular flexion gap at 90 degrees. The size 7 cutting block is pinned in this rotation and the anterior, posterior and chamfer cuts are made with the oscillating saw. The intercondylar block is then placed and that cut is made.  Trial size 7 tibial component, trial size 7 posterior stabilized femur and a 7  mm posterior stabilized rotating platform insert trial is placed. Full extension is achieved with excellent varus/valgus and anterior/posterior balance throughout full range of motion. The patella is everted and thickness measured to be 27  mm. Free hand resection is taken to 15 mm, a 41 template is placed, lug holes  are drilled, trial patella is placed, and it tracks normally. Osteophytes are removed off the posterior femur with the trial in place. All trials are removed and the cut bone surfaces prepared with pulsatile lavage. Cement is mixed and once ready for implantation, the size 7 tibial implant, size  7 posterior stabilized femoral component, and the size 41 patella are cemented in place and the patella is held with the clamp. The trial insert is placed and the knee held in full extension. The Exparel (20 ml mixed with 60 ml saline) is injected into the extensor mechanism, posterior capsule, medial and lateral gutters and subcutaneous tissues.  All extruded cement is removed and once the cement is hard the permanent 7 mm posterior stabilized rotating platform insert is placed into the tibial tray.      The wound is copiously irrigated with saline solution and the extensor mechanism closed over a hemovac drain with #1 V-loc suture. The tourniquet is released for a total tourniquet time of 35  minutes. Flexion against gravity is 140 degrees and the patella tracks normally. Subcutaneous tissue is closed with 2.0 vicryl and subcuticular with running 4.0 Monocryl. The incision is cleaned and dried and steri-strips and a bulky sterile dressing are applied. The limb is placed into a knee immobilizer and the patient is awakened and transported to recovery in stable condition.      Please note that a surgical assistant was a medical necessity for this procedure in order to perform it in a safe and expeditious manner. Surgical assistant was necessary to retract the ligaments and vital neurovascular structures to prevent injury to them and also necessary for proper positioning of the limb to allow for anatomic placement of the prosthesis.   Dione Plover Tyonna Talerico, MD    01/15/2020, 8:27 AM

## 2020-01-15 NOTE — Interval H&P Note (Signed)
History and Physical Interval Note:  01/15/2020 6:47 AM  Erik Reid  has presented today for surgery, with the diagnosis of Right knee osteoarthritis.  The various methods of treatment have been discussed with the patient and family. After consideration of risks, benefits and other options for treatment, the patient has consented to  Procedure(s) with comments: TOTAL KNEE ARTHROPLASTY (Right) - as a surgical intervention.  The patient's history has been reviewed, patient examined, no change in status, stable for surgery.  I have reviewed the patient's chart and labs.  Questions were answered to the patient's satisfaction.     Homero Fellers Matt Delpizzo

## 2020-01-15 NOTE — Anesthesia Procedure Notes (Addendum)
Procedure Name: MAC Date/Time: 01/15/2020 7:16 AM Performed by: West Pugh, CRNA Pre-anesthesia Checklist: Patient identified, Emergency Drugs available, Suction available, Patient being monitored and Timeout performed Patient Re-evaluated:Patient Re-evaluated prior to induction Oxygen Delivery Method: Simple face mask Preoxygenation: Pre-oxygenation with 100% oxygen Induction Type: IV induction Number of attempts: 1 Placement Confirmation: positive ETCO2 Dental Injury: Teeth and Oropharynx as per pre-operative assessment

## 2020-01-17 ENCOUNTER — Encounter: Payer: Self-pay | Admitting: *Deleted

## 2020-01-17 NOTE — Anesthesia Postprocedure Evaluation (Signed)
Anesthesia Post Note  Patient: Erik Reid  Procedure(s) Performed: TOTAL KNEE ARTHROPLASTY (Right Knee)     Patient location during evaluation: PACU Anesthesia Type: Regional, MAC and Spinal Level of consciousness: awake and alert Pain management: pain level controlled Vital Signs Assessment: post-procedure vital signs reviewed and stable Respiratory status: spontaneous breathing, nonlabored ventilation, respiratory function stable and patient connected to nasal cannula oxygen Cardiovascular status: stable and blood pressure returned to baseline Postop Assessment: no apparent nausea or vomiting and spinal receding Anesthetic complications: no    Last Vitals:  Vitals:   01/15/20 1200 01/15/20 1415  BP: 128/90 105/61  Pulse: 63 63  Resp:  16  Temp:  37.2 C  SpO2: 97% 93%    Last Pain:  Vitals:   01/15/20 1415  TempSrc:   PainSc: 2                  Sweta Halseth

## 2020-02-28 NOTE — Progress Notes (Signed)
DUE TO COVID-19 ONLY ONE VISITOR IS ALLOWED TO COME WITH YOU AND STAY IN THE WAITING ROOM ONLY DURING PRE OP AND PROCEDURE DAY OF SURGERY. THE 1 VISITOR MAY VISIT WITH YOU AFTER SURGERY IN YOUR PRIVATE ROOM DURING VISITING HOURS ONLY!  YOU NEED TO HAVE A COVID 19 TEST ON__4/8/_____ @_0800______ , THIS TEST MUST BE DONE BEFORE SURGERY, COME  [address], [place] , .  [facility]) ONCE YOUR COVID TEST IS COMPLETED, PLEASE BEGIN THE QUARANTINE INSTRUCTIONS AS OUTLINED IN YOUR HANDOUT.                Erik Reid  02/28/2020   Your procedure is scheduled on:  03/04/20   Report to [facility] Main  Entrance   Report to admitting at    100pm     Call this number if you have problems the morning of surgery [phone]    Remember: Do not eat food   :After Midnight. BRUSH YOUR TEETH MORNING OF SURGERY AND RINSE YOUR MOUTH OUT, NO CHEWING GUM CANDY OR MINTS.     Take these medicines the morning of surgery with A SIP OF WATER: flexeriol, gabapentin, protonix   DO NOT TAKE ANY DIABETIC MEDICATIONS DAY OF YOUR SURGERY                               You may not have any metal on your body including hair pins and              piercings  Do not wear jewelry, make-up, lotions, powders or perfumes, deodorant             Do not wear nail polish on your fingernails.  Do not shave  48 hours prior to surgery.              Men may shave face and neck.   Do not bring valuables to the hospital. [facility] IS NOT             RESPONSIBLE   FOR VALUABLES.  Contacts, dentures or bridgework may not be worn into surgery.  Leave suitcase in the car. After surgery it may be brought to your room.     Patients discharged the day of surgery will not be allowed to drive home. IF YOU ARE HAVING SURGERY AND GOING HOME THE SAME DAY, YOU MUST HAVE AN ADULT TO DRIVE YOU HOME AND BE WITH YOU FOR 24 HOURS. YOU MAY GO HOME BY TAXI OR UBER OR ORTHERWISE, BUT AN ADULT MUST ACCOMPANY YOU  HOME AND STAY WITH YOU FOR 24 HOURS.  Name and phone number of your driver:  Special Instructions: N/A              Please read over the following fact sheets you were given: _____________________________________________________________________             NO SOLID FOOD AFTER MIDNIGHT THE NIGHT PRIOR TO SURGERY. NOTHING BY MOUTH EXCEPT CLEAR LIQUIDS UNTIL  0430am . PLEASE FINISH ENSURE DRINK PER SURGEON ORDER  WHICH NEEDS TO BE COMPLETED AT .0430am    CLEAR LIQUID DIET   Foods Allowed  Foods Excluded  Coffee and tea, regular and decaf                             liquids that you cannot  Plain Jell-O any favor except red or purple                                           see through such as: Fruit ices (not with fruit pulp)                                     milk, soups, orange juice  Iced Popsicles                                    All solid food Carbonated beverages, regular and diet                                    Cranberry, grape and apple juices Sports drinks like Gatorade Lightly seasoned clear broth or consume(fat free) Sugar, honey syrup  Sample Menu Breakfast                                Lunch                                     Supper Cranberry juice                    Beef broth                            Chicken broth Jell-O                                     Grape juice                           Apple juice Coffee or tea                        Jell-O                                      Popsicle                                                Coffee or tea                        Coffee or tea  _____________________________________________________________________  [facility] - Preparing for Surgery Before surgery, you can play an important role.  Because skin is not sterile, your skin  needs to be as free of germs as possible.  You can reduce the number of germs on your skin by washing with CHG  (chlorahexidine gluconate) soap before surgery.  CHG is an antiseptic cleaner which kills germs and bonds with the skin to continue killing germs even after washing. Please DO NOT use if you have an allergy to CHG or antibacterial soaps.  If your skin becomes reddened/irritated stop using the CHG and inform your nurse when you arrive at Short Stay. Do not shave (including legs and underarms) for at least 48 hours prior to the first CHG shower.  You may shave your face/neck. Please follow these instructions carefully:  1.  Shower with CHG Soap the night before surgery and the  morning of Surgery.  2.  If you choose to wash your hair, wash your hair first as usual with your  normal  shampoo.  3.  After you shampoo, rinse your hair and body thoroughly to remove the  shampoo.                           4.  Use CHG as you would any other liquid soap.  You can apply chg directly  to the skin and wash                       Gently with a scrungie or clean washcloth.  5.  Apply the CHG Soap to your body ONLY FROM THE NECK DOWN.   Do not use on face/ open                           Wound or open sores. Avoid contact with eyes, ears mouth and genitals (private parts).                       Wash face,  Genitals (private parts) with your normal soap.             6.  Wash thoroughly, paying special attention to the area where your surgery  will be performed.  7.  Thoroughly rinse your body with warm water from the neck down.  8.  DO NOT shower/wash with your normal soap after using and rinsing off  the CHG Soap.                9.  Pat yourself dry with a clean towel.            10.  Wear clean pajamas.            11.  Place clean sheets on your bed the night of your first shower and do not  sleep with pets. Day of Surgery : Do not apply any lotions/deodorants the morning of surgery.  Please wear clean clothes to the hospital/surgery center.  FAILURE TO FOLLOW THESE INSTRUCTIONS MAY RESULT IN THE CANCELLATION OF  YOUR SURGERY PATIENT SIGNATURE_________________________________  NURSE SIGNATURE__________________________________  ________________________________________________________________________

## 2020-02-29 ENCOUNTER — Other Ambulatory Visit: Payer: Self-pay

## 2020-02-29 ENCOUNTER — Encounter (HOSPITAL_COMMUNITY)
Admission: RE | Admit: 2020-02-29 | Discharge: 2020-02-29 | Disposition: A | Discharge status: Home / Self Care | Payer: Commercial Managed Care - PPO | Source: Ambulatory Visit | Attending: Orthopedic Surgery | Admitting: Orthopedic Surgery

## 2020-02-29 ENCOUNTER — Other Ambulatory Visit (HOSPITAL_COMMUNITY)
Admission: RE | Admit: 2020-02-29 | Discharge: 2020-02-29 | Disposition: A | Discharge status: Home / Self Care | Payer: Commercial Managed Care - PPO | Source: Ambulatory Visit | Attending: Orthopedic Surgery | Admitting: Orthopedic Surgery

## 2020-02-29 DIAGNOSIS — Z01812 Encounter for preprocedural laboratory examination: Secondary | ICD-10-CM | POA: Diagnosis present

## 2020-02-29 DIAGNOSIS — Z20822 Contact with and (suspected) exposure to covid-19: Secondary | ICD-10-CM | POA: Diagnosis not present

## 2020-02-29 LAB — SARS CORONAVIRUS 2 (TAT 6-24 HRS): SARS Coronavirus 2: NEGATIVE

## 2020-02-29 LAB — CBC
HCT: 42.3 % (ref 39.0–52.0)
Hemoglobin: 13.3 g/dL (ref 13.0–17.0)
MCH: 29.4 pg (ref 26.0–34.0)
MCHC: 31.4 g/dL (ref 30.0–36.0)
MCV: 93.6 fL (ref 80.0–100.0)
Platelets: 386 10*3/uL (ref 150–400)
RBC: 4.52 MIL/uL (ref 4.22–5.81)
RDW: 13.8 % (ref 11.5–15.5)
WBC: 6.3 10*3/uL (ref 4.0–10.5)
nRBC: 0 % (ref 0.0–0.2)

## 2020-03-01 ENCOUNTER — Other Ambulatory Visit: Payer: Self-pay

## 2020-03-01 ENCOUNTER — Encounter (HOSPITAL_COMMUNITY)
Admission: RE | Admit: 2020-03-01 | Discharge: 2020-03-01 | Disposition: A | Discharge status: Home / Self Care | Payer: Commercial Managed Care - PPO | Source: Ambulatory Visit | Attending: Orthopedic Surgery | Admitting: Orthopedic Surgery

## 2020-03-01 ENCOUNTER — Encounter (HOSPITAL_COMMUNITY): Payer: Self-pay | Admitting: Orthopedic Surgery

## 2020-03-01 NOTE — Progress Notes (Signed)
Pt aware to arrive at [facility] admitting at 1215pm Monday 03/04/2020.

## 2020-03-04 ENCOUNTER — Ambulatory Visit (HOSPITAL_COMMUNITY)
Admission: RE | Admit: 2020-03-04 | Discharge: 2020-03-04 | Disposition: A | Discharge status: Home / Self Care | Payer: Commercial Managed Care - PPO | Attending: Orthopedic Surgery | Admitting: Orthopedic Surgery

## 2020-03-04 ENCOUNTER — Other Ambulatory Visit: Payer: Self-pay

## 2020-03-04 ENCOUNTER — Encounter (HOSPITAL_COMMUNITY)
Admission: RE | Disposition: A | Discharge status: Home / Self Care | Payer: Self-pay | Source: Home / Self Care | Attending: Orthopedic Surgery

## 2020-03-04 ENCOUNTER — Ambulatory Visit (HOSPITAL_COMMUNITY): Payer: Commercial Managed Care - PPO | Admitting: Anesthesiology

## 2020-03-04 ENCOUNTER — Encounter (HOSPITAL_COMMUNITY): Payer: Self-pay | Admitting: Orthopedic Surgery

## 2020-03-04 DIAGNOSIS — Z96651 Presence of right artificial knee joint: Secondary | ICD-10-CM | POA: Diagnosis not present

## 2020-03-04 DIAGNOSIS — T8482XA Fibrosis due to internal orthopedic prosthetic devices, implants and grafts, initial encounter: Secondary | ICD-10-CM

## 2020-03-04 DIAGNOSIS — T8489XA Other specified complication of internal orthopedic prosthetic devices, implants and grafts, initial encounter: Secondary | ICD-10-CM | POA: Diagnosis present

## 2020-03-04 DIAGNOSIS — X58XXXA Exposure to other specified factors, initial encounter: Secondary | ICD-10-CM | POA: Insufficient documentation

## 2020-03-04 DIAGNOSIS — Z6830 Body mass index (BMI) 30.0-30.9, adult: Secondary | ICD-10-CM | POA: Insufficient documentation

## 2020-03-04 DIAGNOSIS — E669 Obesity, unspecified: Secondary | ICD-10-CM | POA: Diagnosis not present

## 2020-03-04 DIAGNOSIS — M24661 Ankylosis, right knee: Secondary | ICD-10-CM | POA: Insufficient documentation

## 2020-03-04 DIAGNOSIS — M109 Gout, unspecified: Secondary | ICD-10-CM | POA: Insufficient documentation

## 2020-03-04 DIAGNOSIS — M1990 Unspecified osteoarthritis, unspecified site: Secondary | ICD-10-CM | POA: Insufficient documentation

## 2020-03-04 DIAGNOSIS — Z79899 Other long term (current) drug therapy: Secondary | ICD-10-CM | POA: Diagnosis not present

## 2020-03-04 DIAGNOSIS — K219 Gastro-esophageal reflux disease without esophagitis: Secondary | ICD-10-CM | POA: Insufficient documentation

## 2020-03-04 HISTORY — PX: KNEE CLOSED REDUCTION: SHX995

## 2020-03-04 HISTORY — DX: Personal history of other diseases of the musculoskeletal system and connective tissue: Z87.39

## 2020-03-04 HISTORY — DX: Unspecified asthma, uncomplicated: J45.909

## 2020-03-04 SURGERY — MANIPULATION, KNEE, CLOSED
Anesthesia: General | Site: Knee | Laterality: Right

## 2020-03-04 MED ORDER — POVIDONE-IODINE 10 % EX SWAB: 2.0000 "application " | Freq: Once | CUTANEOUS | Status: DC

## 2020-03-04 MED ORDER — FENTANYL CITRATE (PF) 100 MCG/2ML IJ SOLN: 25.0000 ug | INTRAMUSCULAR | Status: DC | PRN

## 2020-03-04 MED ORDER — HYDROCODONE-ACETAMINOPHEN 5-325 MG PO TABS: 1.0000 | ORAL_TABLET | Freq: Three times a day (TID) | ORAL | 0 refills | Status: AC | PRN

## 2020-03-04 MED ORDER — MIDAZOLAM HCL 2 MG/2ML IJ SOLN
INTRAMUSCULAR | Status: AC
  Filled 2020-03-04: qty 2

## 2020-03-04 MED ORDER — CHLORHEXIDINE GLUCONATE 4 % EX LIQD: 60.0000 mL | Freq: Once | CUTANEOUS | Status: DC

## 2020-03-04 MED ORDER — PROPOFOL 10 MG/ML IV BOLUS
INTRAVENOUS | Status: DC | PRN
  Administered 2020-03-04: 150 mg via INTRAVENOUS

## 2020-03-04 MED ORDER — LACTATED RINGERS IV SOLN: INTRAVENOUS | Status: DC

## 2020-03-04 MED ORDER — FENTANYL CITRATE (PF) 250 MCG/5ML IJ SOLN
INTRAMUSCULAR | Status: DC | PRN
  Administered 2020-03-04: 150 ug via INTRAVENOUS

## 2020-03-04 MED ORDER — PROPOFOL 10 MG/ML IV BOLUS
INTRAVENOUS | Status: AC
  Filled 2020-03-04: qty 20

## 2020-03-04 MED ORDER — MIDAZOLAM HCL 2 MG/2ML IJ SOLN
INTRAMUSCULAR | Status: DC | PRN
  Administered 2020-03-04: 2 mg via INTRAVENOUS

## 2020-03-04 MED ORDER — FENTANYL CITRATE (PF) 250 MCG/5ML IJ SOLN
INTRAMUSCULAR | Status: AC
  Filled 2020-03-04: qty 5

## 2020-03-04 MED ORDER — LIDOCAINE 2% (20 MG/ML) 5 ML SYRINGE
INTRAMUSCULAR | Status: DC | PRN
  Administered 2020-03-04: 100 mg via INTRAVENOUS

## 2020-03-04 MED ORDER — SODIUM CHLORIDE 0.9 % IV SOLN: INTRAVENOUS | Status: DC

## 2020-03-04 MED ORDER — ONDANSETRON HCL 4 MG/2ML IJ SOLN
INTRAMUSCULAR | Status: DC | PRN
  Administered 2020-03-04: 4 mg via INTRAVENOUS

## 2020-03-04 SURGICAL SUPPLY — 18 items
BNDG ADH 1X3 SHEER STRL LF (GAUZE/BANDAGES/DRESSINGS) IMPLANT
BNDG ADH THN 3X1 STRL LF (GAUZE/BANDAGES/DRESSINGS)
COVER SURGICAL LIGHT HANDLE (MISCELLANEOUS) ×1 IMPLANT
COVER WAND RF STERILE (DRAPES) IMPLANT
GAUZE SPONGE 4X4 12PLY STRL (GAUZE/BANDAGES/DRESSINGS) IMPLANT
GLOVE BIO SURGEON STRL SZ8 (GLOVE) ×1 IMPLANT
GLOVE BIOGEL PI IND STRL 7.0 (GLOVE) ×1 IMPLANT
GLOVE BIOGEL PI IND STRL 8 (GLOVE) ×1 IMPLANT
GLOVE BIOGEL PI INDICATOR 7.0 (GLOVE)
GLOVE BIOGEL PI INDICATOR 8 (GLOVE)
GLOVE SURG SS PI 7.0 STRL IVOR (GLOVE) ×1 IMPLANT
GOWN STRL REUS W/TWL LRG LVL3 (GOWN DISPOSABLE) ×1 IMPLANT
KIT TURNOVER KIT A (KITS) IMPLANT
NDL SAFETY ECLIPSE 18X1.5 (NEEDLE) IMPLANT
NEEDLE HYPO 18GX1.5 SHARP (NEEDLE)
PENCIL SMOKE EVACUATOR (MISCELLANEOUS) IMPLANT
SWABSTICK PVP IODINE (MISCELLANEOUS) ×1 IMPLANT
SYR CONTROL 10ML LL (SYRINGE) IMPLANT

## 2020-03-04 NOTE — Anesthesia Preprocedure Evaluation (Addendum)
Anesthesia Evaluation  Patient identified by MRN, date of birth, ID band Patient awake    Reviewed: Allergy & Precautions, NPO status , Patient's Chart, lab work & pertinent test results  History of Anesthesia Complications Negative for: history of anesthetic complications  Airway Mallampati: II  TM Distance: >3 FB Neck ROM: Full    Dental  (+) Teeth Intact, Dental Advisory Given   Pulmonary asthma ,    Pulmonary exam normal breath sounds clear to auscultation       Cardiovascular Exercise Tolerance: Good negative cardio ROS Normal cardiovascular exam Rhythm:Regular Rate:Normal     Neuro/Psych negative neurological ROS  negative psych ROS   GI/Hepatic Neg liver ROS, GERD  Medicated,  Endo/Other  Obesity   Renal/GU negative Renal ROS     Musculoskeletal  (+) Arthritis , 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  Reproductive/Obstetrics                            Anesthesia Physical Anesthesia Plan  ASA: II  Anesthesia Plan: General   Post-op Pain Management:    Induction: Intravenous  PONV Risk Score and Plan: 2 and Treatment may vary due to age or medical condition and Midazolam  Airway Management Planned: Mask  Additional Equipment:   Intra-op Plan:   Post-operative Plan:   Informed Consent: I have reviewed the patients History and Physical, chart, labs and discussed the procedure including the risks, benefits and alternatives for the proposed anesthesia with the patient or authorized representative who has indicated his/her understanding and acceptance.     Dental advisory given  Plan Discussed with: CRNA  Anesthesia Plan Comments:         Anesthesia Quick Evaluation

## 2020-03-04 NOTE — Transfer of Care (Signed)
Immediate Anesthesia Transfer of Care Note  Patient: Erik Reid  Procedure(s) Performed: CLOSED MANIPULATION KNEE (Right Knee)  Patient Location: PACU  Anesthesia Type:General  Level of Consciousness: awake and alert   Airway & Oxygen Therapy: Patient Spontanous Breathing and Patient connected to face mask oxygen  Post-op Assessment: Report given to RN and Post -op Vital signs reviewed and stable  Post vital signs: Reviewed and stable  Last Vitals:  Vitals Value Taken Time  BP    Temp    Pulse 83 03/04/20 1540  Resp 12 03/04/20 1540  SpO2 100 % 03/04/20 1540  Vitals shown include unvalidated device data.  Last Pain:  Vitals:   03/04/20 1242  TempSrc:   PainSc: 6       Patients Stated Pain Goal: 4 (03/04/20 1242)  Complications: No apparent anesthesia complications

## 2020-03-04 NOTE — H&P (Signed)
CC- Erik Reid is a 49 y.o. male who presents with right knee pain.  HPI- . Knee Pain: Patient presents with stiffness involving the  right knee. Onset of the symptoms was several weeks ago. Inciting event: He had a right Total Knee Arthroplasty on 01/15/20 and had a gout flare post-operatively which led to increased swelling and delayed onset of therapy. He has developed stiffness and scarring in the knee which has not improved despite adequate physical therapy. He presents today for closed manipulaton of the knee.  Past Medical History:  Diagnosis Date  . Arthritis   . Asthma    as a child   . Dysphagia   . GERD (gastroesophageal reflux disease)   . Gout   . Gout   . History of degenerative disc disease   . Pneumonia    as a child     Past Surgical History:  Procedure Laterality Date  . BACK SURGERY    . BIOPSY  09/09/2012   Procedure: BIOPSY;  Surgeon: Malissa Hippo, MD;  Location: [facility];  Service: Endoscopy;  Laterality: N/A;  . ESOPHAGEAL DILATION  05/08/2012   Procedure: ESOPHAGEAL DILATION;  Surgeon: Malissa Hippo, MD;  Location: [facility];  Service: Endoscopy;  Laterality: N/A;  . ESOPHAGOGASTRODUODENOSCOPY  05/08/2012   Procedure: ESOPHAGOGASTRODUODENOSCOPY (EGD);  Surgeon: Malissa Hippo, MD;  Location: [facility];  Service: Endoscopy;  Laterality: N/A;  . FOREIGN BODY REMOVAL  05/08/2012   Procedure: FOREIGN BODY REMOVAL;  Surgeon: Malissa Hippo, MD;  Location: [facility];  Service: Endoscopy;  Laterality: N/A;  . TOTAL KNEE ARTHROPLASTY Right 01/15/2020   Procedure: TOTAL KNEE ARTHROPLASTY;  Surgeon: Ollen Gross, MD;  Location: [facility];  Service: Orthopedics;  Laterality: Right;     Prior to Admission medications   Medication Sig Start Date End Date Taking? Authorizing Provider  cyclobenzaprine (FLEXERIL) 10 MG tablet Take 10 mg by mouth in the morning, at noon, and at bedtime.   Yes [provider]  gabapentin (NEURONTIN) 300 MG  capsule Take a 300 mg capsule three times a day for two weeks following surgery.Then take a 300 mg capsule two times a day for two weeks. Then take a 300 mg capsule once a day for two weeks. Then discontinue. Patient taking differently: Take 300 mg by mouth 2 (two) times daily.  01/15/20  Yes Edmisten, Kristie L, PA  pantoprazole (PROTONIX) 40 MG tablet Take 40 mg by mouth daily.  01/14/15  Yes [provider]  traMADol (ULTRAM) 50 MG tablet Take 1-2 tablets (50-100 mg total) by mouth every 6 (six) hours as needed for moderate pain. Patient taking differently: Take 50 mg by mouth in the morning, at noon, and at bedtime.  01/15/20 01/14/21 Yes Edmisten, Kristie L, PA  ULORIC 80 MG TABS Take 80 mg by mouth daily. 06/02/16  Yes [provider]  methocarbamol (ROBAXIN) 500 MG tablet Take 1 tablet (500 mg total) by mouth every 6 (six) hours as needed. Patient not taking: Reported on 02/27/2020 01/15/20   Edmisten, Danford Bad L, PA  oxyCODONE (ROXICODONE) 5 MG immediate release tablet Take 1-2 tablets (5-10 mg total) by mouth every 6 (six) hours as needed for severe pain. Patient not taking: Reported on 02/27/2020 01/15/20 01/14/21  Arther Abbott L, PA   Right Knee antalgic gait, no warmth or effusion, reduced range of motion (5-90), collateral ligaments intact  Physical Examination: General appearance - alert, well appearing, and in no distress  Mental status - alert, oriented to person, place, and time Chest - clear to auscultation, no wheezes, rales or rhonchi, symmetric air entry Heart - normal rate, regular rhythm, normal S1, S2, no murmurs, rubs, clicks or gallops Abdomen - soft, nontender, nondistended, no masses or organomegaly Neurological - alert, oriented, normal speech, no focal findings or movement disorder noted   Asessment/Plan--- Right knee arthrofibrosis- - Plan right knee closed manipulation. Procedure risks and potential comps discussed with patient who elects to proceed.  Goals are decreased pain and increased function with a high likelihood of achieving both

## 2020-03-04 NOTE — Anesthesia Postprocedure Evaluation (Signed)
Anesthesia Post Note  Patient: Erik Reid  Procedure(s) Performed: CLOSED MANIPULATION KNEE (Right Knee)     Patient location during evaluation: PACU Anesthesia Type: General Level of consciousness: awake and alert Pain management: pain level controlled Vital Signs Assessment: post-procedure vital signs reviewed and stable Respiratory status: spontaneous breathing, nonlabored ventilation and respiratory function stable Cardiovascular status: blood pressure returned to baseline and stable Postop Assessment: no apparent nausea or vomiting Anesthetic complications: no    Last Vitals:  Vitals:   03/04/20 1605 03/04/20 1624  BP: 124/90 (!) 144/93  Pulse: 67 83  Resp: 16 14  Temp: 36.5 C 36.5 C  SpO2: 99% 96%    Last Pain:  Vitals:   03/04/20 1624  TempSrc:   PainSc: 0-No pain                 Cecile Hearing

## 2020-03-04 NOTE — Anesthesia Procedure Notes (Signed)
Date/Time: 03/04/2020 3:19 PM Performed by: Minerva Ends, CRNA Pre-anesthesia Checklist: Emergency Drugs available, Patient identified, Suction available, Patient being monitored and Timeout performed Patient Re-evaluated:Patient Re-evaluated prior to induction Oxygen Delivery Method: Circle system utilized and Simple face mask Preoxygenation: Pre-oxygenation with 100% oxygen Induction Type: IV induction Ventilation: Mask ventilation without difficulty Placement Confirmation: positive ETCO2 and breath sounds checked- equal and bilateral Dental Injury: Teeth and Oropharynx as per pre-operative assessment

## 2020-03-04 NOTE — Op Note (Signed)
  OPERATIVE REPORT   PREOPERATIVE DIAGNOSIS: Arthrofibrosis, Right  knee.   POSTOPERATIVE DIAGNOSIS: Arthrofibrosis, Right knee.   PROCEDURE:  Right  knee closed manipulation.   SURGEON: Weslee Prestage, MD   ASSISTANT: None.   ANESTHESIA: General.   COMPLICATIONS: None.   CONDITION: Stable to Recovery.   Pre-manipulation range of motion is 5-90.  Post-manipulation range of  Motion is 0-125  PROCEDURE IN DETAIL: After successful administration of general  anesthetic, exam under anesthesia was performed showing range of motion  5-90 degrees. I then placed my chest against the proximal tibia,  flexing the knee with audible lysis of adhesions. I was easily able to  get the knee flexed to 125  degrees. I then put the knee back in extension and with some  patellar manipulation and gentle pressure got to full  Extension.The patient was subsequently awakened and transported to Recovery in  stable condition.    

## 2021-05-05 ENCOUNTER — Other Ambulatory Visit: Payer: Self-pay | Admitting: Family Medicine

## 2021-05-05 DIAGNOSIS — Z1211 Encounter for screening for malignant neoplasm of colon: Secondary | ICD-10-CM

## 2021-05-07 ENCOUNTER — Other Ambulatory Visit: Payer: Self-pay | Admitting: Family Medicine

## 2021-05-07 DIAGNOSIS — Z1211 Encounter for screening for malignant neoplasm of colon: Secondary | ICD-10-CM

## 2021-05-27 ENCOUNTER — Ambulatory Visit: Payer: 59 | Admitting: General Surgery

## 2021-06-17 ENCOUNTER — Ambulatory Visit: Payer: 59 | Admitting: General Surgery

## 2022-02-12 DIAGNOSIS — M961 Postlaminectomy syndrome, not elsewhere classified: Secondary | ICD-10-CM | POA: Diagnosis not present

## 2022-02-12 DIAGNOSIS — M5030 Other cervical disc degeneration, unspecified cervical region: Secondary | ICD-10-CM | POA: Diagnosis not present

## 2022-02-12 DIAGNOSIS — Z5181 Encounter for therapeutic drug level monitoring: Secondary | ICD-10-CM | POA: Diagnosis not present

## 2022-02-12 DIAGNOSIS — Z79891 Long term (current) use of opiate analgesic: Secondary | ICD-10-CM | POA: Diagnosis not present

## 2022-02-12 DIAGNOSIS — M5412 Radiculopathy, cervical region: Secondary | ICD-10-CM | POA: Diagnosis not present

## 2022-03-19 DIAGNOSIS — K219 Gastro-esophageal reflux disease without esophagitis: Secondary | ICD-10-CM | POA: Diagnosis not present

## 2022-03-19 DIAGNOSIS — Z13220 Encounter for screening for lipoid disorders: Secondary | ICD-10-CM | POA: Diagnosis not present

## 2022-03-19 DIAGNOSIS — Z1329 Encounter for screening for other suspected endocrine disorder: Secondary | ICD-10-CM | POA: Diagnosis not present

## 2022-03-19 DIAGNOSIS — Z0001 Encounter for general adult medical examination with abnormal findings: Secondary | ICD-10-CM | POA: Diagnosis not present

## 2022-03-19 DIAGNOSIS — E559 Vitamin D deficiency, unspecified: Secondary | ICD-10-CM | POA: Diagnosis not present

## 2022-03-19 DIAGNOSIS — Z131 Encounter for screening for diabetes mellitus: Secondary | ICD-10-CM | POA: Diagnosis not present

## 2022-03-19 DIAGNOSIS — Z0000 Encounter for general adult medical examination without abnormal findings: Secondary | ICD-10-CM | POA: Diagnosis not present

## 2022-03-19 DIAGNOSIS — R69 Illness, unspecified: Secondary | ICD-10-CM | POA: Diagnosis not present

## 2022-06-11 DIAGNOSIS — M5412 Radiculopathy, cervical region: Secondary | ICD-10-CM | POA: Diagnosis not present

## 2022-07-21 DIAGNOSIS — J40 Bronchitis, not specified as acute or chronic: Secondary | ICD-10-CM | POA: Diagnosis not present

## 2022-07-21 DIAGNOSIS — J329 Chronic sinusitis, unspecified: Secondary | ICD-10-CM | POA: Diagnosis not present

## 2022-07-21 DIAGNOSIS — R030 Elevated blood-pressure reading, without diagnosis of hypertension: Secondary | ICD-10-CM | POA: Diagnosis not present

## 2022-08-11 DIAGNOSIS — J329 Chronic sinusitis, unspecified: Secondary | ICD-10-CM | POA: Diagnosis not present

## 2022-08-11 DIAGNOSIS — R030 Elevated blood-pressure reading, without diagnosis of hypertension: Secondary | ICD-10-CM | POA: Diagnosis not present

## 2022-08-11 DIAGNOSIS — J40 Bronchitis, not specified as acute or chronic: Secondary | ICD-10-CM | POA: Diagnosis not present

## 2022-08-11 DIAGNOSIS — Z6831 Body mass index (BMI) 31.0-31.9, adult: Secondary | ICD-10-CM | POA: Diagnosis not present

## 2022-10-13 DIAGNOSIS — M5412 Radiculopathy, cervical region: Secondary | ICD-10-CM | POA: Diagnosis not present

## 2022-10-28 DIAGNOSIS — Z6831 Body mass index (BMI) 31.0-31.9, adult: Secondary | ICD-10-CM | POA: Diagnosis not present

## 2022-10-28 DIAGNOSIS — M542 Cervicalgia: Secondary | ICD-10-CM | POA: Diagnosis not present

## 2022-10-28 DIAGNOSIS — R030 Elevated blood-pressure reading, without diagnosis of hypertension: Secondary | ICD-10-CM | POA: Diagnosis not present

## 2022-10-28 DIAGNOSIS — M25519 Pain in unspecified shoulder: Secondary | ICD-10-CM | POA: Diagnosis not present

## 2022-11-03 DIAGNOSIS — M19011 Primary osteoarthritis, right shoulder: Secondary | ICD-10-CM | POA: Diagnosis not present

## 2022-11-03 DIAGNOSIS — M25511 Pain in right shoulder: Secondary | ICD-10-CM | POA: Diagnosis not present

## 2022-11-03 DIAGNOSIS — M75101 Unspecified rotator cuff tear or rupture of right shoulder, not specified as traumatic: Secondary | ICD-10-CM | POA: Diagnosis not present

## 2022-11-03 DIAGNOSIS — X58XXXA Exposure to other specified factors, initial encounter: Secondary | ICD-10-CM | POA: Diagnosis not present

## 2022-11-03 DIAGNOSIS — S43211A Anterior subluxation of right sternoclavicular joint, initial encounter: Secondary | ICD-10-CM | POA: Diagnosis not present

## 2022-12-04 DIAGNOSIS — Z96651 Presence of right artificial knee joint: Secondary | ICD-10-CM | POA: Diagnosis not present

## 2022-12-04 DIAGNOSIS — M1712 Unilateral primary osteoarthritis, left knee: Secondary | ICD-10-CM | POA: Diagnosis not present

## 2023-01-18 DIAGNOSIS — M5010 Cervical disc disorder with radiculopathy, unspecified cervical region: Secondary | ICD-10-CM | POA: Diagnosis not present

## 2023-01-18 DIAGNOSIS — M961 Postlaminectomy syndrome, not elsewhere classified: Secondary | ICD-10-CM | POA: Diagnosis not present

## 2023-01-18 DIAGNOSIS — Z79899 Other long term (current) drug therapy: Secondary | ICD-10-CM | POA: Diagnosis not present

## 2023-01-18 DIAGNOSIS — Z5181 Encounter for therapeutic drug level monitoring: Secondary | ICD-10-CM | POA: Diagnosis not present

## 2023-01-18 DIAGNOSIS — R296 Repeated falls: Secondary | ICD-10-CM | POA: Diagnosis not present

## 2023-01-27 DIAGNOSIS — M5412 Radiculopathy, cervical region: Secondary | ICD-10-CM | POA: Diagnosis not present

## 2023-02-10 DIAGNOSIS — M5412 Radiculopathy, cervical region: Secondary | ICD-10-CM | POA: Diagnosis not present

## 2023-02-10 DIAGNOSIS — M5030 Other cervical disc degeneration, unspecified cervical region: Secondary | ICD-10-CM | POA: Diagnosis not present

## 2023-02-16 DIAGNOSIS — M5412 Radiculopathy, cervical region: Secondary | ICD-10-CM | POA: Diagnosis not present

## 2023-02-16 DIAGNOSIS — M4312 Spondylolisthesis, cervical region: Secondary | ICD-10-CM | POA: Diagnosis not present

## 2023-02-16 DIAGNOSIS — Z6830 Body mass index (BMI) 30.0-30.9, adult: Secondary | ICD-10-CM | POA: Diagnosis not present

## 2023-02-16 DIAGNOSIS — M542 Cervicalgia: Secondary | ICD-10-CM | POA: Diagnosis not present

## 2023-03-26 DIAGNOSIS — E559 Vitamin D deficiency, unspecified: Secondary | ICD-10-CM | POA: Diagnosis not present

## 2023-03-26 DIAGNOSIS — Z131 Encounter for screening for diabetes mellitus: Secondary | ICD-10-CM | POA: Diagnosis not present

## 2023-03-26 DIAGNOSIS — Z1329 Encounter for screening for other suspected endocrine disorder: Secondary | ICD-10-CM | POA: Diagnosis not present

## 2023-03-26 DIAGNOSIS — Z13220 Encounter for screening for lipoid disorders: Secondary | ICD-10-CM | POA: Diagnosis not present

## 2023-03-26 DIAGNOSIS — K219 Gastro-esophageal reflux disease without esophagitis: Secondary | ICD-10-CM | POA: Diagnosis not present

## 2023-03-26 DIAGNOSIS — Z0001 Encounter for general adult medical examination with abnormal findings: Secondary | ICD-10-CM | POA: Diagnosis not present

## 2023-03-29 DIAGNOSIS — R2991 Unspecified symptoms and signs involving the musculoskeletal system: Secondary | ICD-10-CM | POA: Diagnosis not present

## 2023-03-29 DIAGNOSIS — Z0001 Encounter for general adult medical examination with abnormal findings: Secondary | ICD-10-CM | POA: Diagnosis not present

## 2023-03-29 DIAGNOSIS — M109 Gout, unspecified: Secondary | ICD-10-CM | POA: Diagnosis not present

## 2023-03-29 DIAGNOSIS — Z6831 Body mass index (BMI) 31.0-31.9, adult: Secondary | ICD-10-CM | POA: Diagnosis not present

## 2023-03-29 DIAGNOSIS — J45909 Unspecified asthma, uncomplicated: Secondary | ICD-10-CM | POA: Diagnosis not present

## 2023-03-29 DIAGNOSIS — Z1331 Encounter for screening for depression: Secondary | ICD-10-CM | POA: Diagnosis not present

## 2023-03-29 DIAGNOSIS — F1020 Alcohol dependence, uncomplicated: Secondary | ICD-10-CM | POA: Diagnosis not present

## 2023-03-29 DIAGNOSIS — Z1389 Encounter for screening for other disorder: Secondary | ICD-10-CM | POA: Diagnosis not present

## 2023-03-29 DIAGNOSIS — F322 Major depressive disorder, single episode, severe without psychotic features: Secondary | ICD-10-CM | POA: Diagnosis not present

## 2023-03-29 DIAGNOSIS — M25519 Pain in unspecified shoulder: Secondary | ICD-10-CM | POA: Diagnosis not present

## 2023-03-29 DIAGNOSIS — R030 Elevated blood-pressure reading, without diagnosis of hypertension: Secondary | ICD-10-CM | POA: Diagnosis not present

## 2023-03-29 DIAGNOSIS — K219 Gastro-esophageal reflux disease without esophagitis: Secondary | ICD-10-CM | POA: Diagnosis not present

## 2023-04-08 ENCOUNTER — Ambulatory Visit (INDEPENDENT_AMBULATORY_CARE_PROVIDER_SITE_OTHER): Payer: PPO | Admitting: Podiatry

## 2023-04-08 DIAGNOSIS — Z91199 Patient's noncompliance with other medical treatment and regimen due to unspecified reason: Secondary | ICD-10-CM

## 2023-04-08 NOTE — Progress Notes (Signed)
Pt was a no show for apt, CG 

## 2023-05-19 DIAGNOSIS — M961 Postlaminectomy syndrome, not elsewhere classified: Secondary | ICD-10-CM | POA: Diagnosis not present

## 2023-05-19 DIAGNOSIS — R296 Repeated falls: Secondary | ICD-10-CM | POA: Diagnosis not present

## 2023-05-19 DIAGNOSIS — M5030 Other cervical disc degeneration, unspecified cervical region: Secondary | ICD-10-CM | POA: Diagnosis not present

## 2023-05-19 DIAGNOSIS — M5412 Radiculopathy, cervical region: Secondary | ICD-10-CM | POA: Diagnosis not present

## 2023-05-19 DIAGNOSIS — M25512 Pain in left shoulder: Secondary | ICD-10-CM | POA: Diagnosis not present

## 2023-08-11 DIAGNOSIS — R296 Repeated falls: Secondary | ICD-10-CM | POA: Diagnosis not present

## 2023-08-11 DIAGNOSIS — M5030 Other cervical disc degeneration, unspecified cervical region: Secondary | ICD-10-CM | POA: Diagnosis not present

## 2023-08-11 DIAGNOSIS — M5412 Radiculopathy, cervical region: Secondary | ICD-10-CM | POA: Diagnosis not present

## 2023-08-11 DIAGNOSIS — M961 Postlaminectomy syndrome, not elsewhere classified: Secondary | ICD-10-CM | POA: Diagnosis not present

## 2023-08-11 DIAGNOSIS — M25512 Pain in left shoulder: Secondary | ICD-10-CM | POA: Diagnosis not present

## 2023-08-17 DIAGNOSIS — M5412 Radiculopathy, cervical region: Secondary | ICD-10-CM | POA: Diagnosis not present

## 2023-11-05 DIAGNOSIS — J00 Acute nasopharyngitis [common cold]: Secondary | ICD-10-CM | POA: Diagnosis not present

## 2023-12-23 DIAGNOSIS — M25511 Pain in right shoulder: Secondary | ICD-10-CM | POA: Diagnosis not present

## 2023-12-23 DIAGNOSIS — R296 Repeated falls: Secondary | ICD-10-CM | POA: Diagnosis not present

## 2023-12-23 DIAGNOSIS — M5412 Radiculopathy, cervical region: Secondary | ICD-10-CM | POA: Diagnosis not present

## 2023-12-23 DIAGNOSIS — Z79899 Other long term (current) drug therapy: Secondary | ICD-10-CM | POA: Diagnosis not present

## 2023-12-23 DIAGNOSIS — M5030 Other cervical disc degeneration, unspecified cervical region: Secondary | ICD-10-CM | POA: Diagnosis not present

## 2023-12-23 DIAGNOSIS — M25512 Pain in left shoulder: Secondary | ICD-10-CM | POA: Diagnosis not present

## 2023-12-23 DIAGNOSIS — Z5181 Encounter for therapeutic drug level monitoring: Secondary | ICD-10-CM | POA: Diagnosis not present

## 2023-12-23 DIAGNOSIS — M7542 Impingement syndrome of left shoulder: Secondary | ICD-10-CM | POA: Diagnosis not present

## 2023-12-23 DIAGNOSIS — M961 Postlaminectomy syndrome, not elsewhere classified: Secondary | ICD-10-CM | POA: Diagnosis not present

## 2024-01-06 DIAGNOSIS — M5412 Radiculopathy, cervical region: Secondary | ICD-10-CM | POA: Diagnosis not present

## 2024-04-20 DIAGNOSIS — M25512 Pain in left shoulder: Secondary | ICD-10-CM | POA: Diagnosis not present

## 2024-04-20 DIAGNOSIS — M961 Postlaminectomy syndrome, not elsewhere classified: Secondary | ICD-10-CM | POA: Diagnosis not present

## 2024-04-20 DIAGNOSIS — R296 Repeated falls: Secondary | ICD-10-CM | POA: Diagnosis not present

## 2024-04-20 DIAGNOSIS — M7542 Impingement syndrome of left shoulder: Secondary | ICD-10-CM | POA: Diagnosis not present

## 2024-04-20 DIAGNOSIS — M25511 Pain in right shoulder: Secondary | ICD-10-CM | POA: Diagnosis not present

## 2024-04-20 DIAGNOSIS — M5030 Other cervical disc degeneration, unspecified cervical region: Secondary | ICD-10-CM | POA: Diagnosis not present

## 2024-04-20 DIAGNOSIS — M5412 Radiculopathy, cervical region: Secondary | ICD-10-CM | POA: Diagnosis not present

## 2024-06-19 DIAGNOSIS — D559 Anemia due to enzyme disorder, unspecified: Secondary | ICD-10-CM | POA: Diagnosis not present

## 2024-06-19 DIAGNOSIS — Z1329 Encounter for screening for other suspected endocrine disorder: Secondary | ICD-10-CM | POA: Diagnosis not present

## 2024-06-19 DIAGNOSIS — E559 Vitamin D deficiency, unspecified: Secondary | ICD-10-CM | POA: Diagnosis not present

## 2024-06-19 DIAGNOSIS — Z13220 Encounter for screening for lipoid disorders: Secondary | ICD-10-CM | POA: Diagnosis not present

## 2024-06-19 DIAGNOSIS — Z131 Encounter for screening for diabetes mellitus: Secondary | ICD-10-CM | POA: Diagnosis not present

## 2024-09-11 DIAGNOSIS — M5412 Radiculopathy, cervical region: Secondary | ICD-10-CM | POA: Diagnosis not present

## 2024-09-11 DIAGNOSIS — M961 Postlaminectomy syndrome, not elsewhere classified: Secondary | ICD-10-CM | POA: Diagnosis not present

## 2024-10-12 DIAGNOSIS — Z6829 Body mass index (BMI) 29.0-29.9, adult: Secondary | ICD-10-CM | POA: Diagnosis not present

## 2024-10-12 DIAGNOSIS — K219 Gastro-esophageal reflux disease without esophagitis: Secondary | ICD-10-CM | POA: Diagnosis not present

## 2024-10-12 DIAGNOSIS — F1020 Alcohol dependence, uncomplicated: Secondary | ICD-10-CM | POA: Diagnosis not present

## 2024-10-12 DIAGNOSIS — Z1331 Encounter for screening for depression: Secondary | ICD-10-CM | POA: Diagnosis not present

## 2024-10-12 DIAGNOSIS — Z0001 Encounter for general adult medical examination with abnormal findings: Secondary | ICD-10-CM | POA: Diagnosis not present

## 2024-10-12 DIAGNOSIS — M25519 Pain in unspecified shoulder: Secondary | ICD-10-CM | POA: Diagnosis not present

## 2024-10-12 DIAGNOSIS — M545 Low back pain, unspecified: Secondary | ICD-10-CM | POA: Diagnosis not present

## 2024-10-12 DIAGNOSIS — J45909 Unspecified asthma, uncomplicated: Secondary | ICD-10-CM | POA: Diagnosis not present

## 2024-10-12 DIAGNOSIS — Z1389 Encounter for screening for other disorder: Secondary | ICD-10-CM | POA: Diagnosis not present

## 2024-10-12 DIAGNOSIS — G8929 Other chronic pain: Secondary | ICD-10-CM | POA: Diagnosis not present

## 2024-10-12 DIAGNOSIS — M109 Gout, unspecified: Secondary | ICD-10-CM | POA: Diagnosis not present

## 2024-10-12 DIAGNOSIS — M542 Cervicalgia: Secondary | ICD-10-CM | POA: Diagnosis not present

## 2024-10-17 ENCOUNTER — Encounter (INDEPENDENT_AMBULATORY_CARE_PROVIDER_SITE_OTHER): Payer: Self-pay | Admitting: *Deleted
